# Patient Record
Sex: Male | Born: 1950 | Race: Black or African American | Hispanic: No | Marital: Single | Smoking: Never smoker
Health system: Southern US, Community
[De-identification: ages and names within clinical notes are randomized; demographics above are authoritative.]

## PROBLEM LIST (undated history)

## (undated) DIAGNOSIS — E119 Type 2 diabetes mellitus without complications: Secondary | ICD-10-CM

## (undated) DIAGNOSIS — I10 Essential (primary) hypertension: Secondary | ICD-10-CM

---

## 2013-06-04 ENCOUNTER — Emergency Department (HOSPITAL_COMMUNITY)
Admission: EM | Admit: 2013-06-04 | Discharge: 2013-06-04 | Disposition: A | Discharge status: Home / Self Care | Payer: Non-veteran care | Attending: Emergency Medicine | Admitting: Emergency Medicine

## 2013-06-04 ENCOUNTER — Encounter (HOSPITAL_COMMUNITY): Payer: Self-pay | Admitting: Emergency Medicine

## 2013-06-04 DIAGNOSIS — T2016XA Burn of first degree of forehead and cheek, initial encounter: Secondary | ICD-10-CM | POA: Insufficient documentation

## 2013-06-04 DIAGNOSIS — Y9389 Activity, other specified: Secondary | ICD-10-CM | POA: Insufficient documentation

## 2013-06-04 DIAGNOSIS — T2010XA Burn of first degree of head, face, and neck, unspecified site, initial encounter: Secondary | ICD-10-CM

## 2013-06-04 DIAGNOSIS — Z79899 Other long term (current) drug therapy: Secondary | ICD-10-CM | POA: Insufficient documentation

## 2013-06-04 DIAGNOSIS — IMO0002 Reserved for concepts with insufficient information to code with codable children: Secondary | ICD-10-CM | POA: Insufficient documentation

## 2013-06-04 DIAGNOSIS — Z791 Long term (current) use of non-steroidal anti-inflammatories (NSAID): Secondary | ICD-10-CM | POA: Insufficient documentation

## 2013-06-04 DIAGNOSIS — Y929 Unspecified place or not applicable: Secondary | ICD-10-CM | POA: Insufficient documentation

## 2013-06-04 MED ORDER — SILVER SULFADIAZINE 1 % EX CREA
TOPICAL_CREAM | Freq: Two times a day (BID) | CUTANEOUS | Status: DC
  Administered 2013-06-04: 14:00:00 via TOPICAL
  Filled 2013-06-04: qty 85

## 2013-06-04 MED ORDER — SILVER SULFADIAZINE 1 % EX CREA: TOPICAL_CREAM | Freq: Two times a day (BID) | CUTANEOUS | Status: AC

## 2013-06-04 NOTE — Discharge Instructions (Signed)
Please follow up with your primary care physician in 1-2 days. If you do not have one please call the [facility] number listed above. Please use silvadene cream as prescribed. Please read all discharge instructions and return precautions.    Burn Care Your skin is a natural barrier to infection. It is the largest organ of your body. Burns damage this natural protection. To help prevent infection, it is very important to follow your caregiver's instructions in the care of your burn. Burns are classified as:  First degree. There is only redness of the skin (erythema). No scarring is expected.  Second degree. There is blistering of the skin. Scarring may occur with deeper burns.  Third degree. All layers of the skin are injured, and scarring is expected. HOME CARE INSTRUCTIONS   Wash your hands well before changing your bandage.  Change your bandage as often as directed by your caregiver.  Remove the old bandage. If the bandage sticks, you may soak it off with cool, clean water.  Cleanse the burn thoroughly but gently with mild soap and water.  Pat the area dry with a clean, dry cloth.  Apply a thin layer of antibacterial cream to the burn.  Apply a clean bandage as instructed by your caregiver.  Keep the bandage as clean and dry as possible.  Elevate the affected area for the first 24 hours, then as instructed by your caregiver.  Only take over-the-counter or prescription medicines for pain, discomfort, or fever as directed by your caregiver. SEEK IMMEDIATE MEDICAL CARE IF:   You develop excessive pain.  You develop redness, tenderness, swelling, or red streaks near the burn.  The burned area develops yellowish-[place] fluid (pus) or a bad smell.  You have a fever. MAKE SURE YOU:   Understand these instructions.  Will watch your condition.  Will get help right away if you are not doing well or get worse. Document Released: 03/11/2005 Document Revised:  06/03/2011 Document Reviewed: 08/01/2010 [facility] Patient Information 2014 [employer], [place].
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
## 2013-06-04 NOTE — ED Notes (Signed)
PT ambulated with baseline gait; VSS; A&Ox3; no signs of distress; respirations even and unlabored; skin warm and dry; no questions upon discharge.  
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
## 2013-06-07 NOTE — ED Provider Notes (Signed)
Medical screening examination/treatment/procedure(s) were performed by non-physician practitioner and as supervising physician I was immediately available for consultation/collaboration.   Emry Barbato T Marialuisa Basara, MD 06/07/13 0733 

## 2013-06-16 ENCOUNTER — Ambulatory Visit: Payer: Self-pay

## 2013-06-16 ENCOUNTER — Encounter: Payer: Self-pay | Admitting: Internal Medicine

## 2013-06-16 ENCOUNTER — Ambulatory Visit: Payer: Self-pay | Attending: Internal Medicine | Admitting: Internal Medicine

## 2013-06-16 VITALS — BP 138/97 | HR 80 | Temp 98.1°F | Resp 16 | Ht 74.0 in | Wt 214.0 lb

## 2013-06-16 DIAGNOSIS — E785 Hyperlipidemia, unspecified: Secondary | ICD-10-CM | POA: Insufficient documentation

## 2013-06-16 DIAGNOSIS — T300 Burn of unspecified body region, unspecified degree: Secondary | ICD-10-CM

## 2013-06-16 DIAGNOSIS — F329 Major depressive disorder, single episode, unspecified: Secondary | ICD-10-CM | POA: Insufficient documentation

## 2013-06-16 DIAGNOSIS — F3289 Other specified depressive episodes: Secondary | ICD-10-CM | POA: Insufficient documentation

## 2013-06-16 DIAGNOSIS — Z09 Encounter for follow-up examination after completed treatment for conditions other than malignant neoplasm: Secondary | ICD-10-CM | POA: Insufficient documentation
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
## 2013-06-16 NOTE — Patient Instructions (Signed)
Burn Care  Your skin is a natural barrier to infection. It is the largest organ of your body. Burns damage this natural protection. To help prevent infection, it is very important to follow your caregiver's instructions in the care of your burn.  Burns are classified as:   First degree. There is only redness of the skin (erythema). No scarring is expected.   Second degree. There is blistering of the skin. Scarring may occur with deeper burns.   Third degree. All layers of the skin are injured, and scarring is expected.  HOME CARE INSTRUCTIONS    Wash your hands well before changing your bandage.   Change your bandage as often as directed by your caregiver.   Remove the old bandage. If the bandage sticks, you may soak it off with cool, clean water.   Cleanse the burn thoroughly but gently with mild soap and water.   Pat the area dry with a clean, dry cloth.   Apply a thin layer of antibacterial cream to the burn.   Apply a clean bandage as instructed by your caregiver.   Keep the bandage as clean and dry as possible.   Elevate the affected area for the first 24 hours, then as instructed by your caregiver.   Only take over-the-counter or prescription medicines for pain, discomfort, or fever as directed by your caregiver.  SEEK IMMEDIATE MEDICAL CARE IF:    You develop excessive pain.   You develop redness, tenderness, swelling, or red streaks near the burn.   The burned area develops yellowish-[place] fluid (pus) or a bad smell.   You have a fever.  MAKE SURE YOU:    Understand these instructions.   Will watch your condition.   Will get help right away if you are not doing well or get worse.  Document Released: 03/11/2005 Document Revised: 06/03/2011 Document Reviewed: 08/01/2010  ExitCare Patient Information 2014 [employer].

## 2013-07-26 ENCOUNTER — Ambulatory Visit: Payer: Self-pay | Admitting: Internal Medicine

## 2018-11-10 ENCOUNTER — Telehealth: Payer: Self-pay | Admitting: Internal Medicine

## 2018-11-10 NOTE — Telephone Encounter (Signed)
Pt appears on [facility] Quality Report for [facility], but has never been seen in the office.  I called the pt and left a message asking him to call me at [phone] to confirm his PCP.  VDM (DD)

## 2019-01-21 ENCOUNTER — Telehealth: Payer: Self-pay | Admitting: Internal Medicine

## 2019-01-21 NOTE — Telephone Encounter (Signed)
2nd attempt to reach pt to confirm PCP. No answer

## 2019-07-01 ENCOUNTER — Emergency Department (HOSPITAL_COMMUNITY): Payer: No Typology Code available for payment source

## 2019-07-01 ENCOUNTER — Emergency Department (HOSPITAL_COMMUNITY)
Admission: EM | Admit: 2019-07-01 | Discharge: 2019-07-01 | Disposition: A | Discharge status: Home / Self Care | Payer: No Typology Code available for payment source | Attending: Emergency Medicine | Admitting: Emergency Medicine

## 2019-07-01 ENCOUNTER — Encounter (HOSPITAL_COMMUNITY): Payer: Self-pay | Admitting: *Deleted

## 2019-07-01 DIAGNOSIS — E119 Type 2 diabetes mellitus without complications: Secondary | ICD-10-CM | POA: Insufficient documentation

## 2019-07-01 DIAGNOSIS — S20219A Contusion of unspecified front wall of thorax, initial encounter: Secondary | ICD-10-CM | POA: Diagnosis not present

## 2019-07-01 DIAGNOSIS — I10 Essential (primary) hypertension: Secondary | ICD-10-CM | POA: Insufficient documentation

## 2019-07-01 DIAGNOSIS — Y939 Activity, unspecified: Secondary | ICD-10-CM | POA: Insufficient documentation

## 2019-07-01 DIAGNOSIS — Y92410 Unspecified street and highway as the place of occurrence of the external cause: Secondary | ICD-10-CM | POA: Insufficient documentation

## 2019-07-01 DIAGNOSIS — Y999 Unspecified external cause status: Secondary | ICD-10-CM | POA: Insufficient documentation

## 2019-07-01 DIAGNOSIS — Z79899 Other long term (current) drug therapy: Secondary | ICD-10-CM | POA: Insufficient documentation

## 2019-07-01 DIAGNOSIS — S161XXA Strain of muscle, fascia and tendon at neck level, initial encounter: Secondary | ICD-10-CM | POA: Diagnosis not present

## 2019-07-01 DIAGNOSIS — S299XXA Unspecified injury of thorax, initial encounter: Secondary | ICD-10-CM | POA: Diagnosis present

## 2019-07-01 HISTORY — DX: Essential (primary) hypertension: I10

## 2019-07-01 HISTORY — DX: Type 2 diabetes mellitus without complications: E11.9

## 2019-07-01 LAB — I-STAT CREATININE, ED: Creatinine, Ser: 1.1 mg/dL (ref 0.61–1.24)

## 2019-07-01 MED ORDER — IOHEXOL 300 MG/ML  SOLN
75.0000 mL | Freq: Once | INTRAMUSCULAR | Status: AC | PRN
  Administered 2019-07-01: 75 mL via INTRAVENOUS
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
## 2019-07-01 NOTE — ED Notes (Signed)
Per provider C-collar removed.  No pain at this time. Pt verbalized  Understanding of discharge instructions and pain control. No further questions at this time.

## 2019-07-01 NOTE — Discharge Instructions (Addendum)
Return here as needed.  Return here for any worsening in your condition.  Your CT scans did not show any abnormalities at this time.  Follow-up with your primary care doctor.

## 2019-11-25 DIAGNOSIS — S0181XA Laceration without foreign body of other part of head, initial encounter: Secondary | ICD-10-CM | POA: Diagnosis not present

## 2019-11-25 DIAGNOSIS — W19XXXA Unspecified fall, initial encounter: Secondary | ICD-10-CM | POA: Diagnosis not present

## 2019-11-25 DIAGNOSIS — G8911 Acute pain due to trauma: Secondary | ICD-10-CM | POA: Diagnosis not present

## 2019-11-25 DIAGNOSIS — R55 Syncope and collapse: Secondary | ICD-10-CM | POA: Diagnosis not present

## 2019-11-25 DIAGNOSIS — Z20822 Contact with and (suspected) exposure to covid-19: Secondary | ICD-10-CM | POA: Diagnosis not present

## 2019-11-26 DIAGNOSIS — R55 Syncope and collapse: Secondary | ICD-10-CM | POA: Diagnosis not present

## 2019-11-27 DIAGNOSIS — E119 Type 2 diabetes mellitus without complications: Secondary | ICD-10-CM | POA: Diagnosis not present

## 2019-11-27 DIAGNOSIS — R55 Syncope and collapse: Secondary | ICD-10-CM | POA: Diagnosis not present

## 2020-04-14 DIAGNOSIS — R462 Strange and inexplicable behavior: Secondary | ICD-10-CM | POA: Diagnosis not present

## 2020-05-12 ENCOUNTER — Other Ambulatory Visit: Payer: Self-pay | Admitting: *Deleted
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
## 2020-05-16 ENCOUNTER — Other Ambulatory Visit: Payer: Self-pay

## 2020-05-16 NOTE — Patient Outreach (Addendum)
[facility]) Care Management  05/16/2020  Shaheed Schmuck 1950/10/08 [id number]   Referral date:05/12/20 Referral source:[employer] discharge notification report  Diagnosis: Falls, Anterior Cervical discectomy and fusion at C 4/5, small subacute right parietal infarct.( per care everywhere) Date of Discharge:05/11/20 Facility:[facility]  Insurance:[employer]    Outreach Attempt: No answer.  HIPAA compliant voice message left.    Plan: RN CM will attempt again within 4 business days.   Update: 10:15 am: received call from [phone]- male states wrong number for [facility].    Telephone call to Home number-number invalid.  Will update system.  Telephone call to Orvilla Fus- she is a care taker.  She will try to contact patient.   Spoke with patient. He states he is ok.  [facility] with care.  He states that [place] is his primary care and he has an appointment on 05-31-20.  Patient has medications but decline review as nurse is coming to review medications.  Discussed [employer] benefits and transportation.  Given transportation information.     Plan: RN CM will close case as patient not eligible for service at this time due to PCP being out of network.  Bary Leriche, RN, MSN [facility] Care Management Care Management Coordinator Direct Line [phone] Cell [phone] Toll Free: [phone]  Fax: [phone]

## 2020-08-06 ENCOUNTER — Other Ambulatory Visit: Payer: Self-pay

## 2020-08-06 ENCOUNTER — Emergency Department (HOSPITAL_COMMUNITY)
Admission: EM | Admit: 2020-08-06 | Discharge: 2020-08-06 | Disposition: A | Discharge status: Home / Self Care | Payer: No Typology Code available for payment source | Attending: Emergency Medicine | Admitting: Emergency Medicine

## 2020-08-06 DIAGNOSIS — I10 Essential (primary) hypertension: Secondary | ICD-10-CM | POA: Insufficient documentation

## 2020-08-06 DIAGNOSIS — Z79899 Other long term (current) drug therapy: Secondary | ICD-10-CM | POA: Insufficient documentation

## 2020-08-06 DIAGNOSIS — R21 Rash and other nonspecific skin eruption: Secondary | ICD-10-CM

## 2020-08-06 DIAGNOSIS — E119 Type 2 diabetes mellitus without complications: Secondary | ICD-10-CM | POA: Diagnosis not present

## 2020-08-06 DIAGNOSIS — L255 Unspecified contact dermatitis due to plants, except food: Secondary | ICD-10-CM | POA: Diagnosis not present

## 2020-08-06 DIAGNOSIS — R0902 Hypoxemia: Secondary | ICD-10-CM | POA: Diagnosis not present

## 2020-08-06 DIAGNOSIS — F17220 Nicotine dependence, chewing tobacco, uncomplicated: Secondary | ICD-10-CM | POA: Diagnosis not present

## 2020-08-06 LAB — CBC WITH DIFFERENTIAL/PLATELET
Abs Immature Granulocytes: 0.02 10*3/uL (ref 0.00–0.07)
Basophils Absolute: 0 10*3/uL (ref 0.0–0.1)
Basophils Relative: 1 %
Eosinophils Absolute: 0.3 10*3/uL (ref 0.0–0.5)
Eosinophils Relative: 4 %
HCT: 40.9 % (ref 39.0–52.0)
Hemoglobin: 13.4 g/dL (ref 13.0–17.0)
Immature Granulocytes: 0 %
Lymphocytes Relative: 29 %
Lymphs Abs: 2 10*3/uL (ref 0.7–4.0)
MCH: 29.3 pg (ref 26.0–34.0)
MCHC: 32.8 g/dL (ref 30.0–36.0)
MCV: 89.5 fL (ref 80.0–100.0)
Monocytes Absolute: 0.3 10*3/uL (ref 0.1–1.0)
Monocytes Relative: 5 %
Neutro Abs: 4.2 10*3/uL (ref 1.7–7.7)
Neutrophils Relative %: 61 %
Platelets: 297 10*3/uL (ref 150–400)
RBC: 4.57 MIL/uL (ref 4.22–5.81)
RDW: 14.6 % (ref 11.5–15.5)
WBC: 6.9 10*3/uL (ref 4.0–10.5)
nRBC: 0 % (ref 0.0–0.2)

## 2020-08-06 LAB — COMPREHENSIVE METABOLIC PANEL
ALT: 22 U/L (ref 0–44)
AST: 31 U/L (ref 15–41)
Albumin: 4.3 g/dL (ref 3.5–5.0)
Alkaline Phosphatase: 64 U/L (ref 38–126)
Anion gap: 8 (ref 5–15)
BUN: 12 mg/dL (ref 8–23)
CO2: 27 mmol/L (ref 22–32)
Calcium: 9.6 mg/dL (ref 8.9–10.3)
Chloride: 104 mmol/L (ref 98–111)
Creatinine, Ser: 1.21 mg/dL (ref 0.61–1.24)
GFR, Estimated: >60 mL/min (ref >60)
Glucose, Bld: 122 mg/dL — ABNORMAL HIGH (ref 70–99)
Potassium: 3.7 mmol/L (ref 3.5–5.1)
Sodium: 139 mmol/L (ref 135–145)
Total Bilirubin: 0.7 mg/dL (ref 0.3–1.2)
Total Protein: 7.2 g/dL (ref 6.5–8.1)

## 2020-08-06 LAB — RPR: RPR Ser Ql: NONREACTIVE

## 2020-08-06 MED ORDER — METHYLPREDNISOLONE 4 MG PO TBPK: ORAL_TABLET | ORAL | 0 refills | Status: DC

## 2020-08-06 MED ORDER — HYDROXYZINE HCL 25 MG PO TABS: 25.0000 mg | ORAL_TABLET | Freq: Four times a day (QID) | ORAL | 0 refills | Status: AC

## 2020-08-06 NOTE — Discharge Instructions (Addendum)
Your labs showed no significant abnormalities. I am unsure of the cause of your rash. I discharging you with medication for treatment of your rash and itching. Please note that the atarax can make you very sleepy and  you should not drive if you take it.  Also avoid taking benadryl with this medication. Get help right away if you: Have a fever and your symptoms suddenly get worse. Develop confusion. Swelling of the mouth or throat. Difficulty breathing. Have a severe headache or a stiff neck. Have severe joint pains or stiffness. Have a seizure. Develop a rash that covers all or most of your body. The rash may or may not be painful. Develop blisters that: Are on top of the rash. Grow larger or grow together. Are painful. Are inside your nose or mouth. Develop a rash that: Looks like purple pinprick-sized spots all over your body. Has a "bull's eye" or looks like a target. Is not related to sun exposure, is red and painful, and causes your skin to peel.

## 2020-08-06 NOTE — ED Provider Notes (Signed)
[facility] EMERGENCY DEPARTMENT Provider Note   CSN: [id number] Arrival date & time: 08/06/20  0815     History No chief complaint on file.   Luis Hawkins is a 70 y.o. male who presents with a cc of pruritic rash. Onset 2 days ago. Located on left palm, fingers, arms, chest, face, ears, and neck. No new meds, no changes in lotions, soaps, or detergents. No history of the same. No hx of eczema.  Has worked outside. No fever, flu-like sxs or known tick bites. He has tried benadryl and hydrocortizone without relief of sxs.  HPI     Past Medical History:  Diagnosis Date  . Diabetes mellitus without complication (HCC)   . Hypertension     There are no problems to display for this patient.   No past surgical history on file.     No family history on file.  Social History   Tobacco Use  . Smoking status: Never Smoker  . Smokeless tobacco: Current User    Types: Chew  Substance Use Topics  . Alcohol use: No  . Drug use: No    Home Medications Prior to Admission medications   Medication Sig Start Date End Date Taking? Authorizing Provider  docusate sodium (COLACE) 100 MG capsule Take 100 mg by mouth daily.    [provider]  guaiFENesin 200 MG tablet Take 400 mg by mouth every 4 (four) hours as needed for cough or to loosen phlegm.    [provider]  ipratropium (ATROVENT) 0.06 % nasal spray Place 2 sprays into both nostrils daily.    [provider]  levothyroxine (SYNTHROID) 25 MCG tablet Take 25 mcg by mouth daily before breakfast.    [provider]  losartan (COZAAR) 50 MG tablet Take 50 mg by mouth daily.    [provider]  meloxicam (MOBIC) 7.5 MG tablet Take 15 mg by mouth daily.    [provider]  metoprolol tartrate (LOPRESSOR) 25 MG tablet Take 25 mg by mouth 2 (two) times daily.    [provider]  mirtazapine (REMERON) 15 MG tablet Take 15 mg by mouth at bedtime.     [provider]  OLANZapine (ZYPREXA) 10 MG tablet Take 10 mg by mouth at bedtime.    [provider]  omega-3 acid ethyl esters (LOVAZA) 1 g capsule Take 1 g by mouth daily.    [provider]  pantoprazole (PROTONIX) 20 MG tablet Take 20 mg by mouth daily.    [provider]  silver sulfADIAZINE (SILVADENE) 1 % cream Apply topically 2 (two) times daily. Patient not taking: Reported on 07/01/2019 06/04/13   Piepenbrink, Victorino Dike, PA-C  simvastatin (ZOCOR) 10 MG tablet Take 10 mg by mouth daily.    [provider]  vardenafil (LEVITRA) 20 MG tablet Take 20 mg by mouth daily as needed for erectile dysfunction.    [provider]    Allergies    Patient has no known allergies.  Review of Systems   Review of Systems Ten systems reviewed and are negative for acute change, except as noted in the HPI.   Physical Exam Updated Vital Signs BP (!) 158/100 (BP Location: Left Arm)   Pulse 95   Temp 98.2 F (36.8 C) (Oral)   Resp 20   Ht 6\' 2"  (1.88 m)   Wt 79.4 kg   SpO2 97%   BMI 22.47 kg/m   Physical Exam Vitals and nursing note reviewed.  Constitutional:  General: He is not in acute distress.    Appearance: He is well-developed. He is not diaphoretic.  HENT:     Head: Normocephalic and atraumatic.  Eyes:     General: No scleral icterus.    Conjunctiva/sclera: Conjunctivae normal.  Cardiovascular:     Rate and Rhythm: Normal rate and regular rhythm.     Heart sounds: Normal heart sounds.  Pulmonary:     Effort: Pulmonary effort is normal. No respiratory distress.     Breath sounds: Normal breath sounds.  Abdominal:     Palpations: Abdomen is soft.     Tenderness: There is no abdominal tenderness.  Musculoskeletal:     Cervical back: Normal range of motion and neck supple.  Skin:    General: Skin is warm and dry.     Comments: Left palm with vesicular eruption near the thenar eminence.  Small singular vesicles on the  dorsum of BL hands. Facial skin is tight, drys, and drawn with angular cheilitis, no vesicles or desquamation. Dry scaling ear canals. No obvious scalp involvement. No involvement of the mucous membranes.   Neurological:     Mental Status: He is alert.  Psychiatric:        Behavior: Behavior normal.     ED Results / Procedures / Treatments   Labs (all labs ordered are listed, but only abnormal results are displayed) Labs Reviewed - No data to display  EKG None  Radiology No results found.  Procedures Procedures   Medications Ordered in ED Medications - No data to display  ED Course  I have reviewed the triage vital signs and the nursing notes.  Pertinent labs & imaging results that were available during my care of the patient were reviewed by me and considered in my medical decision making (see chart for details).    MDM Rules/Calculators/A&P                          Patient with nonspecific eruption. No signs of infection. No signs of SJS, TENS, DRESS. Labs ordered  And wnl except for mild hyperglycemia. RPR pending. No petechiae, afebrile, doubt tick born illness or RMSF. Likely contact or dyshidrotic dermatitis.  D/c with prednisone and atarax.   Follow up with PCP in 2-3 days.       Final Clinical Impression(s) / ED Diagnoses Final diagnoses:  None    Rx / DC Orders ED Discharge Orders    None       Arthor Captain, PA-C 08/06/20 1021    Benjiman Core, MD 08/07/20 1455

## 2020-08-06 NOTE — ED Triage Notes (Signed)
Pt arrives via EMS from home. Ambulatory with walker. Pt complains of numbness/ tingles in hands and arms X2 days. Visible rash on hands.  185 palpated HR 105 97% RA T 97.7 CBG 137 RR 18
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
## 2021-09-13 ENCOUNTER — Encounter: Payer: Self-pay | Admitting: Neurology

## 2022-02-19 ENCOUNTER — Ambulatory Visit: Payer: Medicare HMO | Admitting: Neurology

## 2022-04-12 ENCOUNTER — Ambulatory Visit (HOSPITAL_COMMUNITY)
Admission: EM | Admit: 2022-04-12 | Discharge: 2022-04-12 | Disposition: A | Discharge status: Home / Self Care | Payer: Non-veteran care | Attending: Internal Medicine | Admitting: Internal Medicine

## 2022-04-12 ENCOUNTER — Ambulatory Visit (INDEPENDENT_AMBULATORY_CARE_PROVIDER_SITE_OTHER): Payer: Non-veteran care

## 2022-04-12 ENCOUNTER — Encounter (HOSPITAL_COMMUNITY): Payer: Self-pay | Admitting: Emergency Medicine

## 2022-04-12 DIAGNOSIS — R0602 Shortness of breath: Secondary | ICD-10-CM | POA: Diagnosis not present

## 2022-04-12 DIAGNOSIS — J040 Acute laryngitis: Secondary | ICD-10-CM | POA: Diagnosis not present

## 2022-04-12 DIAGNOSIS — R059 Cough, unspecified: Secondary | ICD-10-CM | POA: Diagnosis not present

## 2022-04-12 DIAGNOSIS — J029 Acute pharyngitis, unspecified: Secondary | ICD-10-CM

## 2022-04-12 DIAGNOSIS — J209 Acute bronchitis, unspecified: Secondary | ICD-10-CM

## 2022-04-12 MED ORDER — ALBUTEROL SULFATE HFA 108 (90 BASE) MCG/ACT IN AERS
1.0000 | INHALATION_SPRAY | Freq: Once | RESPIRATORY_TRACT | Status: AC
  Administered 2022-04-12: 2 via RESPIRATORY_TRACT

## 2022-04-12 MED ORDER — AEROCHAMBER PLUS FLO-VU MEDIUM MISC
1.0000 | Freq: Once | Status: AC
  Administered 2022-04-12: 1

## 2022-04-12 MED ORDER — BENZONATATE 100 MG PO CAPS: 100.0000 mg | ORAL_CAPSULE | Freq: Three times a day (TID) | ORAL | 0 refills | Status: AC

## 2022-04-12 MED ORDER — ALBUTEROL SULFATE HFA 108 (90 BASE) MCG/ACT IN AERS
INHALATION_SPRAY | RESPIRATORY_TRACT | Status: AC
  Filled 2022-04-12: qty 6.7

## 2022-04-12 MED ORDER — PREDNISONE 20 MG PO TABS
20.0000 mg | ORAL_TABLET | Freq: Once | ORAL | Status: AC
  Administered 2022-04-12: 20 mg via ORAL

## 2022-04-12 MED ORDER — AEROCHAMBER PLUS FLO-VU LARGE MISC
Status: AC
  Filled 2022-04-12: qty 1

## 2022-04-12 MED ORDER — PREDNISONE 20 MG PO TABS: 20.0000 mg | ORAL_TABLET | Freq: Every day | ORAL | 0 refills | Status: AC

## 2022-04-12 MED ORDER — PREDNISONE 20 MG PO TABS
ORAL_TABLET | ORAL | Status: AC
  Filled 2022-04-12: qty 1

## 2022-04-12 NOTE — ED Triage Notes (Signed)
Pt c/o hoarse voice and sore throat since last week. Using throat lozenges. Pt has cough as well, that is productive with yellow phlegm.

## 2022-04-12 NOTE — ED Provider Notes (Signed)
[facility]    CSN: [id number] Arrival date & time: 04/12/22  1315      History   Chief Complaint Chief Complaint  Patient presents with  . Hoarse  . Sore Throat    HPI Luis Hawkins is a 72 y.o. male.   Patient presents to urgent care for evaluation of your throat, cough, nasal congestion, generalized weakness, generalized fatigue, and voice hoarseness that started 7 days ago.  Cough is dry and nonproductive.  Throat pain is currently a 9 on a scale of 0-10.  He takes Mobic daily but states this has not helped with his pain at all.  He has been using throat lozenges without relief of pain. No recent exposure to known sick contacts. Reports he "may have a history of asthma" but can't remember. He quit smoking 1 year ago, denies drug use. States he is slightly short of breath, denies chest pain and heart palpitations. No weakness, nausea, vomiting, dizziness, diarrhea, abdominal pain, fever/chills, nasal congestion, recent dental problems, rash, or recent  falls reported. Denies recent antibiotic or steroid use. No sick contacts with similar symptoms.    Sore Throat   Past Medical History:  Diagnosis Date  . Diabetes mellitus without complication (HCC)   . Hypertension     There are no problems to display for this patient.   History reviewed. No pertinent surgical history.     Home Medications    Prior to Admission medications   Medication Sig Start Date End Date Taking? Authorizing Provider  benzonatate (TESSALON) 100 MG capsule Take 1 capsule (100 mg total) by mouth every 8 (eight) hours. 04/12/22  Yes Carlisle Beers, FNP  predniSONE (DELTASONE) 20 MG tablet Take 1 tablet (20 mg total) by mouth daily for 5 days. 04/12/22 04/17/22 Yes Spike Desilets, Donavan Burnet, FNP  docusate sodium (COLACE) 100 MG capsule Take 100 mg by mouth daily.    [provider]  guaiFENesin 200 MG tablet Take 400 mg by mouth every 4 (four) hours as needed for cough or to loosen  phlegm.    [provider]  hydrOXYzine (ATARAX/VISTARIL) 25 MG tablet Take 1 tablet (25 mg total) by mouth every 6 (six) hours. 08/06/20   Harris, Cammy Copa, PA-C  ipratropium (ATROVENT) 0.06 % nasal spray Place 2 sprays into both nostrils daily.    [provider]  levothyroxine (SYNTHROID) 25 MCG tablet Take 25 mcg by mouth daily before breakfast.    [provider]  losartan (COZAAR) 50 MG tablet Take 50 mg by mouth daily.    [provider]  meloxicam (MOBIC) 7.5 MG tablet Take 15 mg by mouth daily.    [provider]  metoprolol tartrate (LOPRESSOR) 25 MG tablet Take 25 mg by mouth 2 (two) times daily.    [provider]  mirtazapine (REMERON) 15 MG tablet Take 15 mg by mouth at bedtime.    [provider]  OLANZapine (ZYPREXA) 10 MG tablet Take 10 mg by mouth at bedtime.    [provider]  omega-3 acid ethyl esters (LOVAZA) 1 g capsule Take 1 g by mouth daily.    [provider]  pantoprazole (PROTONIX) 20 MG tablet Take 20 mg by mouth daily.    [provider]  silver sulfADIAZINE (SILVADENE) 1 % cream Apply topically 2 (two) times daily. Patient not taking: Reported on 07/01/2019 06/04/13   Piepenbrink, Victorino Dike, PA-C  simvastatin (ZOCOR) 10 MG tablet Take 10 mg by mouth daily.    [provider]  vardenafil (LEVITRA) 20 MG tablet Take 20 mg by mouth daily as needed for erectile dysfunction.    [provider]    Family History No family history on file.  Social History Social History   Tobacco Use  . Smoking status: Never  . Smokeless tobacco: Current    Types: Chew  Substance Use Topics  . Alcohol use: No  . Drug use: No     Allergies   Patient has no known allergies.   Review of Systems Review of Systems Per HPI  Physical Exam Triage Vital Signs ED Triage Vitals  Enc Vitals Group     BP 04/12/22 1523 120/80     Pulse Rate 04/12/22 1523 96     Resp 04/12/22  1523 18     Temp 04/12/22 1523 98.2 F (36.8 C)     Temp Source 04/12/22 1523 Oral     SpO2 04/12/22 1523 96 %     Weight --      Height --      Head Circumference --      Peak Flow --      Pain Score 04/12/22 1522 8     Pain Loc --      Pain Edu? --      Excl. in [place]? --    No data found.  Updated Vital Signs BP 120/80 (BP Location: Right Arm)   Pulse 96   Temp 98.2 F (36.8 C) (Oral)   Resp 18   SpO2 96%   Visual Acuity Right Eye Distance:   Left Eye Distance:   Bilateral Distance:    Right Eye Near:   Left Eye Near:    Bilateral Near:     Physical Exam Vitals and nursing note reviewed.  Constitutional:      Appearance: He is not ill-appearing or toxic-appearing.  HENT:     Head: Normocephalic and atraumatic.     Right Ear: Hearing, tympanic membrane, ear canal and external ear normal.     Left Ear: Hearing, tympanic membrane, ear canal and external ear normal.     Nose: Rhinorrhea present.     Right Turbinates: Not swollen or pale.     Left Turbinates: Not swollen or pale.     Mouth/Throat:     Lips: Pink.     Mouth: Mucous membranes are moist. No injury or oral lesions.     Dentition: Normal dentition. No dental tenderness or dental caries.     Tongue: No lesions. Tongue does not deviate from midline.     Palate: No mass and lesions.     Pharynx: Uvula midline. Posterior oropharyngeal erythema present. No pharyngeal swelling or uvula swelling.     Tonsils: No tonsillar exudate or tonsillar abscesses.     Comments: Small amount of clear postnasal drainage visualized to the posterior oropharynx. Slight erythema to the posterior oropharynx.  Eyes:     General: Lids are normal. Vision grossly intact. Gaze aligned appropriately.     Extraocular Movements: Extraocular movements intact.     Conjunctiva/sclera: Conjunctivae normal.  Cardiovascular:     Rate and Rhythm: Normal rate and regular rhythm.     Heart sounds: Normal heart sounds, S1 normal and S2 normal.   Pulmonary:     Effort: Pulmonary effort is normal. No respiratory distress.     Breath sounds: Normal air entry. Examination of the right-lower field reveals wheezing and rhonchi. Examination of the left-lower field reveals wheezing and rhonchi. Wheezing and  rhonchi present.     Comments: Expiratory wheeze and rhonchi heard to bilateral lower lung fields. No respiratory distress, accessory muscle usage, or tachypnea.  Musculoskeletal:     Cervical back: Neck supple.  Skin:    General: Skin is warm and dry.     Capillary Refill: Capillary refill takes less than 2 seconds.     Findings: No rash.  Neurological:     General: No focal deficit present.     Mental Status: He is alert and oriented to person, place, and time. Mental status is at baseline.     Cranial Nerves: No dysarthria or facial asymmetry.  Psychiatric:        Mood and Affect: Mood normal.        Speech: Speech normal.        Behavior: Behavior normal.        Thought Content: Thought content normal.        Judgment: Judgment normal.     UC Treatments / Results  Labs (all labs ordered are listed, but only abnormal results are displayed) Labs Reviewed - No data to display  EKG   Radiology DG Chest 2 View  Result Date: 04/12/2022 CLINICAL DATA:  Cough and shortness of breath EXAM: CHEST - 2 VIEW COMPARISON:  Chest x-ray July 01, 2019 FINDINGS: The cardiomediastinal silhouette is unchanged in contour. No focal pulmonary opacity. No pleural effusion or pneumothorax. The visualized upper abdomen is unremarkable. No acute osseous abnormality. IMPRESSION: No acute cardiopulmonary abnormality. Electronically Signed   By: Beryle Flock M.D.   On: 04/12/2022 16:27    Procedures Procedures (including critical care time)  Medications Ordered in UC Medications  albuterol (VENTOLIN HFA) 108 (90 Base) MCG/ACT inhaler 1-2 puff (2 puffs Inhalation Given 04/12/22 1643)  AeroChamber Plus Flo-Vu Medium MISC 1 each (1 each Other  Given 04/12/22 1643)  predniSONE (DELTASONE) tablet 20 mg (20 mg Oral Given 04/12/22 1643)    Initial Impression / Assessment and Plan / UC Course  I have reviewed the triage vital signs and the nursing notes.  Pertinent labs & imaging results that were available during my care of the patient were reviewed by me and considered in my medical decision making (see chart for details).   1. Acute bronchitis, sore throat, laryngitis Presentation is consistent with acute viral bronchitis that will likely improve with use of steroid and cough medications along with rest and increase fluid intake over the next few days.  Chest x-ray performed due to persistent cough is negative for active cardiopulmonary disease/abnormality. Patient is nontoxic in appearance with hemodynamically stable vital signs. Oxygenating well on room air without new oxygen requirement.   Given albuterol with spacer and first dose of prednisone dose in clinic for cough and wheeze. Steroid burst (prednisone 20mg  once daily for 5 days with food) and tessalon perles sent to pharmacy for symptomatic relief to be taken as prescribed. No NSAIDs while taking steroid. Advised to push fluids to stay well hydrated.   Discussed physical exam and available lab work findings in clinic with patient.  Counseled patient regarding appropriate use of medications and potential side effects for all medications recommended or prescribed today. Discussed red flag signs and symptoms of worsening condition,when to call the PCP office, return to urgent care, and when to seek higher level of care in the emergency department. Patient verbalizes understanding and agreement with plan. All questions answered. Patient discharged in stable condition.    Final Clinical Impressions(s) / UC Diagnoses  Final diagnoses:  Acute bronchitis, unspecified organism  Laryngitis  Sore throat     Discharge Instructions      You have bronchitis which is inflammation of  the upper airways in your lungs due to a virus. The following medicines will help with your symptoms.  Your chest x-ray is negative for pneumonia and other abnormalities.  - Take steroid sent to pharmacy as directed. Do not take any other NSAID containing medications such as ibuprofen or naproxen/Aleve while taking prednisone. - You may use albuterol inhaler 1 to 2 puffs every 4-6 hours as needed for cough, shortness of breath, and wheezing. - Tessalon perles every 8 hours as needed for cough.  If you develop any new or worsening symptoms or do not improve in the next 2 to 3 days, please return.  If your symptoms are severe, please go to the emergency room.  Follow-up with your primary care provider for further evaluation and management of your symptoms as well as ongoing wellness visits.  I hope you feel better!     ED Prescriptions     Medication Sig Dispense Auth. Provider   predniSONE (DELTASONE) 20 MG tablet Take 1 tablet (20 mg total) by mouth daily for 5 days. 5 tablet Reita May M, FNP   benzonatate (TESSALON) 100 MG capsule Take 1 capsule (100 mg total) by mouth every 8 (eight) hours. 21 capsule Carlisle Beers, FNP      PDMP not reviewed this encounter.   Carlisle Beers, [place] 04/12/22 1656

## 2022-04-12 NOTE — Discharge Instructions (Addendum)
You have bronchitis which is inflammation of the upper airways in your lungs due to a virus. The following medicines will help with your symptoms.  Your chest x-ray is negative for pneumonia and other abnormalities.  - Take steroid sent to pharmacy as directed. Do not take any other NSAID containing medications such as ibuprofen or naproxen/Aleve while taking prednisone. - You may use albuterol inhaler 1 to 2 puffs every 4-6 hours as needed for cough, shortness of breath, and wheezing. - Tessalon perles every 8 hours as needed for cough.  If you develop any new or worsening symptoms or do not improve in the next 2 to 3 days, please return.  If your symptoms are severe, please go to the emergency room.  Follow-up with your primary care provider for further evaluation and management of your symptoms as well as ongoing wellness visits.  I hope you feel better!
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
## 2022-04-26 ENCOUNTER — Other Ambulatory Visit (INDEPENDENT_AMBULATORY_CARE_PROVIDER_SITE_OTHER): Payer: Medicare PPO

## 2022-04-26 ENCOUNTER — Ambulatory Visit (INDEPENDENT_AMBULATORY_CARE_PROVIDER_SITE_OTHER): Payer: Medicare PPO | Admitting: Neurology

## 2022-04-26 ENCOUNTER — Encounter: Payer: Self-pay | Admitting: Neurology

## 2022-04-26 VITALS — BP 123/86 | HR 94 | Resp 20 | Ht 74.0 in | Wt 193.0 lb

## 2022-04-26 DIAGNOSIS — M6281 Muscle weakness (generalized): Secondary | ICD-10-CM | POA: Diagnosis not present

## 2022-04-26 DIAGNOSIS — G8929 Other chronic pain: Secondary | ICD-10-CM

## 2022-04-26 DIAGNOSIS — M542 Cervicalgia: Secondary | ICD-10-CM

## 2022-04-26 DIAGNOSIS — M545 Low back pain, unspecified: Secondary | ICD-10-CM | POA: Diagnosis not present

## 2022-04-26 LAB — VITAMIN B12: Vitamin B-12: 768 pg/mL (ref 211–911)

## 2022-04-26 LAB — CK: Total CK: 172 U/L (ref 7–232)

## 2022-04-26 NOTE — Patient Instructions (Signed)
[facility] Will check nerve conduction study of left arm and leg Will check CK, B1 and B12 Follow up after testing.

## 2022-04-30 LAB — VITAMIN B1: Vitamin B1 (Thiamine): 22 nmol/L (ref 8–30)

## 2022-05-14 ENCOUNTER — Encounter: Payer: Medicare PPO | Admitting: Neurology

## 2022-05-14 ENCOUNTER — Encounter: Payer: Self-pay | Admitting: Neurology

## 2022-05-14 DIAGNOSIS — Z029 Encounter for administrative examinations, unspecified: Secondary | ICD-10-CM

## 2022-05-17 ENCOUNTER — Ambulatory Visit (INDEPENDENT_AMBULATORY_CARE_PROVIDER_SITE_OTHER): Payer: No Typology Code available for payment source | Admitting: Orthopaedic Surgery

## 2022-05-17 ENCOUNTER — Ambulatory Visit (INDEPENDENT_AMBULATORY_CARE_PROVIDER_SITE_OTHER): Payer: Medicare PPO

## 2022-05-17 DIAGNOSIS — M25562 Pain in left knee: Secondary | ICD-10-CM

## 2022-05-17 DIAGNOSIS — G8929 Other chronic pain: Secondary | ICD-10-CM

## 2022-05-17 MED ORDER — LIDOCAINE HCL 1 % IJ SOLN
2.0000 mL | INTRAMUSCULAR | Status: AC | PRN
  Administered 2022-05-17: 2 mL

## 2022-05-17 MED ORDER — METHYLPREDNISOLONE ACETATE 40 MG/ML IJ SUSP
40.0000 mg | INTRAMUSCULAR | Status: AC | PRN
  Administered 2022-05-17: 40 mg via INTRA_ARTICULAR

## 2022-05-17 MED ORDER — BUPIVACAINE HCL 0.5 % IJ SOLN
2.0000 mL | INTRAMUSCULAR | Status: AC | PRN
  Administered 2022-05-17: 2 mL via INTRA_ARTICULAR

## 2022-05-17 NOTE — Progress Notes (Signed)
Office Visit Note   Patient: Luis Hawkins           Date of Birth: 09/29/1950           MRN: [id number] Visit Date: 05/17/2022              Requested by: [facility] [address] [place],  [place] [address] PCP: [place]: Visit Diagnoses:  1. Chronic pain of left knee     Plan: Luis Hawkins is a 72 year old gentleman with chronic left knee pain.  This could be due to arthritis or post stroke weakness and muscular pain.  We will try cortisone shot to see if this is effective.  If so he could consider these every 3 to 4 months.  I have also strongly recommended that he try outpatient physical therapy.  Follow-up with me as needed.  Follow-Up Instructions: No follow-ups on file.   Orders:  Orders Placed This Encounter  Procedures   XR KNEE 3 VIEW LEFT   No orders of the defined types were placed in this encounter.     Procedures: Large Joint Inj: L knee on 05/17/2022 1:51 PM Details: 22 G needle Medications: 2 mL bupivacaine 0.5 %; 2 mL lidocaine 1 %; 40 mg methylPREDNISolone acetate 40 MG/ML Outcome: tolerated well, no immediate complications Patient was prepped and draped in the usual sterile fashion.       Clinical Data: No additional findings.   Subjective: Chief Complaint  Patient presents with   Left Knee - Pain    HPI  Luis Hawkins is a pleasant 72 year old gentleman here with his friend today.  He has had chronic left knee pain for about 2 years that started after he had a stroke.  He now ambulates with a walker.  He has anterior knee pain.  He uses a lidocaine patch.  Review of Systems  Constitutional: Negative.   HENT: Negative.    Eyes: Negative.   Respiratory: Negative.    Cardiovascular: Negative.   Gastrointestinal: Negative.   Endocrine: Negative.   Genitourinary: Negative.   Skin: Negative.   Allergic/Immunologic: Negative.   Neurological: Negative.   Hematological: Negative.   Psychiatric/Behavioral:  Negative.    All other systems reviewed and are negative.    Objective: Vital Signs: There were no vitals taken for this visit.  Physical Exam Vitals and nursing note reviewed.  Constitutional:      Appearance: He is well-developed.  HENT:     Head: Normocephalic and atraumatic.  Eyes:     Pupils: Pupils are equal, round, and reactive to light.  Pulmonary:     Effort: Pulmonary effort is normal.  Abdominal:     Palpations: Abdomen is soft.  Musculoskeletal:        General: Normal range of motion.     Cervical back: Neck supple.  Skin:    General: Skin is warm.  Neurological:     Mental Status: He is alert and oriented to person, place, and time.  Psychiatric:        Behavior: Behavior normal.        Thought Content: Thought content normal.        Judgment: Judgment normal.     Ortho Exam  Examination of left knee shows no joint effusion.  Collaterals and cruciates are stable.  Normal range of motion.  Specialty Comments:  No specialty comments available.  Imaging: XR KNEE 3 VIEW LEFT  Result Date: 05/17/2022 Mildly decreased joint spaces.  No acute abnormalities.    PMFS History: There are no problems to display for this patient.  Past Medical History:  Diagnosis Date   Diabetes mellitus without complication ([place])    Hypertension     No family history on file.  No past surgical history on file. Social History   Occupational History   Not on file  Tobacco Use   Smoking status: Never   Smokeless tobacco: Current    Types: Chew  Substance and Sexual Activity   Alcohol use: No   Drug use: No   Sexual activity: Not on file

## 2022-06-11 ENCOUNTER — Encounter: Payer: Medicare PPO | Admitting: Neurology

## 2022-06-17 ENCOUNTER — Ambulatory Visit (INDEPENDENT_AMBULATORY_CARE_PROVIDER_SITE_OTHER): Payer: No Typology Code available for payment source | Admitting: Neurology

## 2022-06-17 DIAGNOSIS — G8929 Other chronic pain: Secondary | ICD-10-CM

## 2022-06-17 DIAGNOSIS — M6281 Muscle weakness (generalized): Secondary | ICD-10-CM

## 2022-06-17 DIAGNOSIS — G629 Polyneuropathy, unspecified: Secondary | ICD-10-CM

## 2022-06-17 DIAGNOSIS — M5412 Radiculopathy, cervical region: Secondary | ICD-10-CM

## 2022-06-17 DIAGNOSIS — M5417 Radiculopathy, lumbosacral region: Secondary | ICD-10-CM

## 2022-06-17 DIAGNOSIS — M545 Low back pain, unspecified: Secondary | ICD-10-CM

## 2022-06-17 NOTE — Procedures (Signed)
[facility]  [address], [place]  [place], [place] [address] Tel: [phone] Fax: [phone] Test Date:  06/17/2022  Patient: Luis Hawkins DOB: 01-01-1951 Physician: Kai Levins, MD  Sex: Male Height: 6\' 2"  Ref Phys: Metta Clines, DO  ID#: [id number]   Technician:    History: This is a 71 year old male with weakness.  NCV & EMG Findings: Extensive electrodiagnostic evaluation of the left upper and lower limbs shows: Left sural and superficial peroneal/fibular sensory responses are absent. Left median, ulnar, and radial sensory responses show prolonged distal peak latency (median 3.9, ulnar 3.9, radial 3.1 ms). This may be due to temperature artifact as patient was difficult to keep above 32C.  Left peroneal/fibular (EDB), tibial (AH), and median (APB) are within normal limits. Left ulnar (ADM) shows reduced conduction velocity from above elbow to below elbow stimulation sites (38 m/s).  Left H reflex is within normal limits. Chronic motor axon loss changes without accompanying active denervation changes are seen in the left tibialis anterior, medial head of gastrocnemius, short head of biceps femoris, lumbosacral paraspinal (L5 level), pronator teres, triceps, and cervical paraspinal (C7 level) muscles.   Impression: This is a complex study. 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 The residuals of an old intraspinal canal lesion (ie: motor radiculopathy) at the left S1 root or segment, mild to moderate in degree electrically. The residuals of an old intraspinal canal lesion (ie: motor radiculopathy) at the left C6-C7 roots or segments, mild to moderate in degree electrically. 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 Prolonged distal peak latency prolongation of left median  and radial sensory responses are likely due to temperature artifact.    ___________________________ Kai Levins, MD    Nerve Conduction Studies Motor Nerve Results    Latency Amplitude F-Lat Segment Distance CV Comment  Site (ms) Norm (mV) Norm (ms)  (cm) (m/s) Norm ~30C in hand, 29C in foot  Left Fibular (EDB) Motor  Ankle 5.4  < 6.0 2.6  > 2.5        Bel fib head 15.1 - 2.2 -  Bel fib head-Ankle 39 40  > 40   Pop fossa 17.7 - 2.0 -  Pop fossa-Bel fib head 10 40 -   Left Median (APB) Motor  Wrist 3.9  < 4.0 6.9  > 5.0        Elbow 10.0 - 6.5 -  Elbow-Wrist 31.5 52  > 50   Left Tibial (AH) Motor  Ankle 5.8  < 6.0 5.2  > 4.0        Knee 17.7 - 3.4 -  Knee-Ankle 49 41  > 40   Left Ulnar (ADM) Motor  Wrist 2.8  < 3.1 9.2  > 7.0        Bel elbow 8.0 - 8.3 -  Bel elbow-Wrist 27 52  > 50   Ab elbow 10.6 - 8.0 -  Ab elbow-Bel elbow 10 38 -    Sensory Sites    Neg Peak Lat Amplitude (O-P) Segment Distance Velocity Comment  Site (ms) Norm (V) Norm  (cm) (ms)   Left Median Sensory  Wrist-Dig II *3.9  < 3.8 14  > 10 Wrist-Dig II 13    Left Radial Sensory  Forearm-Wrist *3.1  < 2.8 12  > 10 Forearm-Wrist 10  Left Superficial Fibular Sensory  14 cm-Ankle *NR  < 4.6 *NR  > 3 14 cm-Ankle 14    Left Sural Sensory  Calf-Lat mall *NR  < 4.6 *NR  > 3 Calf-Lat mall 14    Left Ulnar Sensory  Wrist-Dig V *3.9  < 3.2 7  > 5 Wrist-Dig V 11     H-Reflex Results    M-Lat H Lat H Neg Amp H-M Lat  Site (ms) (ms) Norm (mV) (ms)  Left Tibial H-Reflex  Pop fossa 8.8 34.9  < 35.0 3.4 26.1   Electromyography   Side Muscle Ins.Act Fibs Fasc Recrt Amp Dur Poly Activation Comment  Left Tib ant Nml Nml *1+ *2- *1+ *1+ *1+ Nml N/A  Left Gastroc MH *1+ Nml Nml *1- *1+ *1+ *1+ Nml N/A  Left Rectus fem Nml Nml Nml Nml Nml Nml Nml Nml N/A  Left Biceps fem SH Nml Nml Nml *2- *1+ *1+ *2+ Nml N/A  Left Gluteus med Nml Nml Nml Nml Nml Nml Nml Nml N/A  Left L5 PSP Nml Nml Nml *1- *1+ *1+ *1+ Nml N/A   Left FDI Nml Nml Nml Nml Nml Nml Nml Nml N/A  Left EIP Nml Nml Nml Nml Nml Nml Nml Nml N/A  Left ADM Nml Nml Nml Nml Nml Nml Nml Nml N/A  Left Pronator teres Nml Nml Nml *2- *1+ *1+ *1+ Nml N/A  Left Biceps Nml Nml Nml Nml Nml Nml Nml Nml N/A  Left Triceps lat hd Nml Nml Nml *1- *1+ *1+ *1+ Nml N/A  Left Deltoid Nml Nml Nml Nml Nml Nml Nml Nml N/A  Left C7 PSP Nml Nml Nml *1- *1+ *1+ *1+ Nml N/A      Waveforms:  Motor           Sensory             H-Reflex

## 2022-06-20 NOTE — Progress Notes (Signed)
Tried calling patient no answer. Mailbox full, Unable to LVM.

## 2022-06-24 NOTE — Progress Notes (Signed)
Tried calling patient no answer. VM full, Unable to LVM.

## 2022-07-16 ENCOUNTER — Ambulatory Visit: Payer: Medicare PPO | Admitting: Orthopaedic Surgery

## 2022-07-17 ENCOUNTER — Encounter: Payer: Self-pay | Admitting: Orthopaedic Surgery

## 2022-07-17 ENCOUNTER — Ambulatory Visit: Payer: No Typology Code available for payment source | Admitting: Orthopaedic Surgery

## 2022-07-17 DIAGNOSIS — M25562 Pain in left knee: Secondary | ICD-10-CM

## 2022-07-17 DIAGNOSIS — G8929 Other chronic pain: Secondary | ICD-10-CM | POA: Diagnosis not present

## 2022-07-17 NOTE — Addendum Note (Signed)
Addended by: Wendi Maya on: 07/17/2022 08:56 AM   Modules accepted: Orders

## 2022-07-17 NOTE — Progress Notes (Signed)
   Office Visit Note   Patient: Schylar Wuebker           Date of Birth: 12/03/1950           MRN: [id number] Visit Date: 07/17/2022              [facility]ed by: [facility] [address] [place],  [place] [address] [facility]: [facility]   Assessment & Plan: Visit Diagnoses:  1. Chronic pain of left knee     Plan: Impression is chronic left knee pain after sustaining mechanical fall following CVA with left-sided weakness.  At this point, the patient is undergone cortisone injection with only temporary relief.  He continues to endorse pain to the medial aspect of the knee with associated locking.  I would like to go ahead and order an MRI to assess for meniscal pathology.  He will follow-up with [place] once this is been completed.  Call with concerns or questions in the meantime.  Follow-Up Instructions: Return for f/u after MRI.   Orders:  No orders of the defined types were placed in this encounter.  No orders of the defined types were placed in this encounter.     Procedures: No procedures performed   Clinical Data: No additional findings.   Subjective: Chief Complaint  Patient presents with   Left Knee - Follow-up    HPI patient is a pleasant 72 year old gentleman who comes in today with recurrent left knee pain.  This began after a fall which he sustained following a CVA several months back.  He has had pain and weakness to the left leg since.  Majority of his pain is to the anteromedial aspect.  This is constant but worse with walking.  He notes occasional locking sensation to the left knee.  He has been taking meloxicam without relief.  He was seen in our office about 1 month ago where cortisone injection was performed.  This helped but unfortunately I only lasted for a few days.  He is ambulating with a walker as he has left-sided weakness following his CVA.  Has not been to any recent physical therapy.  Review of Systems as detailed in HPI.  All others reviewed  and are negative.   Objective: Vital Signs: There were no vitals taken for this visit.  Physical Exam well-developed well-nourished gentleman in no acute distress.  Alert and oriented x 3.  Ortho Exam left knee exam shows no effusion.  Range of motion: Active 30 to 95 degrees.  I can passively get him to 0 degrees of extension.  Medial joint line tenderness.  No patellofemoral crepitus.  Ligaments are stable.  He is neurovascularly intact distally.  Specialty Comments:  No specialty comments available.  Imaging: No new imaging   PMFS History: There are no problems to display for this patient.  Past Medical History:  Diagnosis Date   Diabetes mellitus without complication    Hypertension     No family history on file.  No past surgical history on file. Social History   Occupational History   Not on file  Tobacco Use   Smoking status: Never   Smokeless tobacco: Current    Types: Chew  Substance and Sexual Activity   Alcohol use: No   Drug use: No   Sexual activity: Not on file

## 2022-08-28 ENCOUNTER — Ambulatory Visit
Admission: RE | Admit: 2022-08-28 | Discharge: 2022-08-28 | Disposition: A | Discharge status: Home / Self Care | Payer: Medicare HMO | Source: Ambulatory Visit | Attending: Orthopaedic Surgery | Admitting: Orthopaedic Surgery

## 2022-08-28 DIAGNOSIS — M25562 Pain in left knee: Secondary | ICD-10-CM | POA: Diagnosis not present

## 2022-08-28 DIAGNOSIS — G8929 Other chronic pain: Secondary | ICD-10-CM

## 2022-08-28 DIAGNOSIS — M25462 Effusion, left knee: Secondary | ICD-10-CM | POA: Diagnosis not present

## 2022-08-28 DIAGNOSIS — M23332 Other meniscus derangements, other medial meniscus, left knee: Secondary | ICD-10-CM | POA: Diagnosis not present

## 2022-08-28 DIAGNOSIS — M1712 Unilateral primary osteoarthritis, left knee: Secondary | ICD-10-CM | POA: Diagnosis not present

## 2022-09-07 NOTE — Progress Notes (Signed)
Needs follow up appt.  Thanks.

## 2022-09-10 ENCOUNTER — Telehealth: Payer: Self-pay | Admitting: Orthopaedic Surgery

## 2022-09-10 NOTE — Telephone Encounter (Signed)
Called patient got recording mailbox is full. Called Eunika Smalls (Contact listed)   she advised will have him call me tomorrow

## 2022-09-11 ENCOUNTER — Telehealth: Payer: Self-pay | Admitting: Orthopaedic Surgery

## 2022-09-11 NOTE — Telephone Encounter (Signed)
Called patient again got recording mailbox is full     could not leave message

## 2022-09-23 NOTE — Progress Notes (Unsigned)
   Office Visit Note   Patient: Luis Hawkins           Date of Birth: 1950-05-04           MRN: [id number] Visit Date: 09/24/2022              Requested by: [facility] [address] [place],  [place] [address] PCP: [facility]   Assessment & Plan: Visit Diagnoses:  1. Acute medial meniscus tear of left knee, initial encounter     Plan: Luis Hawkins is a 72 year old gentleman with medial meniscal tear found on recent left knee MRI scan.  Based on these findings I have recommended arthroscopic partial medial meniscectomy and chondroplasty.  Luis Hawkins will call the patient to confirm surgery time.  Follow-Up Instructions: No follow-ups on file.   Orders:  No orders of the defined types were placed in this encounter.  No orders of the defined types were placed in this encounter.     Procedures: No procedures performed   Clinical Data: No additional findings.   Subjective: Chief Complaint  Patient presents with   Left Knee - Pain    HPI Luis Hawkins returns today to discuss MRI results. Review of Systems  Constitutional: Negative.   HENT: Negative.    Eyes: Negative.   Respiratory: Negative.    Cardiovascular: Negative.   Gastrointestinal: Negative.   Endocrine: Negative.   Genitourinary: Negative.   Skin: Negative.   Allergic/Immunologic: Negative.   Neurological: Negative.   Hematological: Negative.   Psychiatric/Behavioral: Negative.    All other systems reviewed and are negative.    Objective: Vital Signs: There were no vitals taken for this visit.  Physical Exam Vitals and nursing note reviewed.  Constitutional:      Appearance: He is well-developed.  Pulmonary:     Effort: Pulmonary effort is normal.  Abdominal:     Palpations: Abdomen is soft.  Skin:    General: Skin is warm.  Neurological:     Mental Status: He is alert and oriented to person, place, and time.  Psychiatric:        Behavior: Behavior normal.        Thought  Content: Thought content normal.        Judgment: Judgment normal.     Ortho Exam Examination of the left knee is unchanged. Specialty Comments:  No specialty comments available.  Imaging: No results found.   PMFS History: There are no problems to display for this patient.  Past Medical History:  Diagnosis Date   Diabetes mellitus without complication ([facility])    Hypertension     No family history on file.  No past surgical history on file. Social History   Occupational History   Not on file  Tobacco Use   Smoking status: Never   Smokeless tobacco: Current    Types: Chew  Substance and Sexual Activity   Alcohol use: No   Drug use: No   Sexual activity: Not on file

## 2022-09-24 ENCOUNTER — Ambulatory Visit (INDEPENDENT_AMBULATORY_CARE_PROVIDER_SITE_OTHER): Payer: No Typology Code available for payment source | Admitting: Orthopaedic Surgery

## 2022-09-24 DIAGNOSIS — S83242A Other tear of medial meniscus, current injury, left knee, initial encounter: Secondary | ICD-10-CM

## 2022-10-21 ENCOUNTER — Other Ambulatory Visit: Payer: Self-pay | Admitting: Physician Assistant

## 2022-10-21 MED ORDER — HYDROCODONE-ACETAMINOPHEN 5-325 MG PO TABS: 1.0000 | ORAL_TABLET | Freq: Three times a day (TID) | ORAL | 0 refills | Status: DC | PRN

## 2022-10-21 MED ORDER — ONDANSETRON HCL 4 MG PO TABS: 4.0000 mg | ORAL_TABLET | Freq: Three times a day (TID) | ORAL | 0 refills | Status: AC | PRN

## 2022-10-24 ENCOUNTER — Encounter: Payer: Self-pay | Admitting: Orthopaedic Surgery

## 2022-10-24 DIAGNOSIS — S83242A Other tear of medial meniscus, current injury, left knee, initial encounter: Secondary | ICD-10-CM

## 2022-10-24 DIAGNOSIS — M94262 Chondromalacia, left knee: Secondary | ICD-10-CM

## 2022-10-25 ENCOUNTER — Telehealth: Payer: Self-pay

## 2022-10-25 NOTE — Telephone Encounter (Signed)
Patient's friend, Tyrone Nine called stating that Rx for Hydrocodone that was sent to [facility] on 10/21/2022, needs to be sent to [facility] in [place].  Patient had left knee surgery.  [phone].  CB# for Tyrone Nine is [phone].  Please advise.  Thank you.

## 2022-10-28 ENCOUNTER — Other Ambulatory Visit: Payer: Self-pay | Admitting: Physician Assistant

## 2022-10-28 MED ORDER — HYDROCODONE-ACETAMINOPHEN 5-325 MG PO TABS: 1.0000 | ORAL_TABLET | Freq: Three times a day (TID) | ORAL | 0 refills | Status: AC | PRN

## 2022-10-28 NOTE — Telephone Encounter (Signed)
sent 

## 2022-11-01 ENCOUNTER — Encounter: Payer: Medicare HMO | Admitting: Physician Assistant

## 2022-11-12 ENCOUNTER — Ambulatory Visit (INDEPENDENT_AMBULATORY_CARE_PROVIDER_SITE_OTHER): Payer: Medicare PPO | Admitting: Orthopaedic Surgery

## 2022-11-12 DIAGNOSIS — S83242A Other tear of medial meniscus, current injury, left knee, initial encounter: Secondary | ICD-10-CM

## 2022-11-12 NOTE — Progress Notes (Signed)
   Post-Op Visit Note   Patient: Luis Hawkins           Date of Birth: 1950/06/13           MRN: [id number] Visit Date: 11/12/2022 [facility]: [facility]   Assessment & Plan:  Chief Complaint:  Chief Complaint  Patient presents with   Left Knee - Routine Post Op    /LEFT KNEE PMM (surgery date 10-24-22)   Visit Diagnoses:  1. Acute medial meniscus tear of left knee, initial encounter     Plan: Mr. Stofflet is approximately 3 weeks postop from left knee scope.  He is doing well overall.  He has been taking Tylenol for the pain occasionally.  Has no real complaints other than he feels he needs a little bit more strength for ambulation.  Examination of the left knee shows fully healed surgical scars.  No signs of infection.  Range of motion is progressing very nicely.  I would like to send him to outpatient PT for knee rehab particularly for gait training and strengthening.  I would like to recheck him in another 4 weeks or so.  Follow-Up Instructions: Return in about 4 weeks (around 12/10/2022) for lindsey.   Orders:  Orders Placed This Encounter  Procedures   Ambulatory referral to Physical Therapy   No orders of the defined types were placed in this encounter.   Imaging: No results found.  PMFS History: There are no problems to display for this patient.  Past Medical History:  Diagnosis Date   Diabetes mellitus without complication ([facility])    Hypertension     No family history on file.  No past surgical history on file. Social History   Occupational History   Not on file  Tobacco Use   Smoking status: Never   Smokeless tobacco: Current    Types: Chew  Substance and Sexual Activity   Alcohol use: No   Drug use: No   Sexual activity: Not on file

## 2022-11-20 ENCOUNTER — Other Ambulatory Visit: Payer: Self-pay

## 2022-11-20 ENCOUNTER — Ambulatory Visit: Payer: Medicare PPO | Admitting: Physical Therapy

## 2022-11-20 ENCOUNTER — Ambulatory Visit (INDEPENDENT_AMBULATORY_CARE_PROVIDER_SITE_OTHER): Payer: Medicare PPO | Admitting: Physical Therapy

## 2022-11-20 ENCOUNTER — Encounter: Payer: Self-pay | Admitting: Physical Therapy

## 2022-11-20 DIAGNOSIS — M6281 Muscle weakness (generalized): Secondary | ICD-10-CM

## 2022-11-20 DIAGNOSIS — R2689 Other abnormalities of gait and mobility: Secondary | ICD-10-CM

## 2022-11-20 DIAGNOSIS — R600 Localized edema: Secondary | ICD-10-CM | POA: Diagnosis not present

## 2022-11-20 DIAGNOSIS — M25562 Pain in left knee: Secondary | ICD-10-CM | POA: Diagnosis not present

## 2022-11-20 DIAGNOSIS — M25662 Stiffness of left knee, not elsewhere classified: Secondary | ICD-10-CM

## 2022-11-20 NOTE — Therapy (Signed)
OUTPATIENT PHYSICAL THERAPY EVALUATION   Patient Name: Luis Hawkins MRN: [id number] DOB:04-Mar-1951, 72 y.o., male Today's Date: 11/20/2022  END OF SESSION:  PT End of Session - 11/20/22 1329     Visit Number 1    Number of Visits 12    Date for PT Re-Evaluation 01/01/23    Authorization Type [employer] $25 copay    Progress Note Due on Visit 10    PT Start Time 1330    PT Stop Time 1415    PT Time Calculation (min) 45 min    Activity Tolerance Patient tolerated treatment well    Behavior During Therapy WFL for tasks assessed/performed             Past Medical History:  Diagnosis Date   Diabetes mellitus without complication (HCC)    Hypertension    History reviewed. No pertinent surgical history. There are no problems to display for this patient.   PCP: [facility]  REFERRING PROVIDER: Tarry Kos, MD  REFERRING DIAG: [phone] (ICD-10-CM) - Acute medial meniscus tear of left knee, initial encounter  Rationale for Evaluation and Treatment: Rehabilitation  THERAPY DIAG:  Acute pain of left knee - Plan: PT plan of care cert/re-cert  Stiffness of left knee, not elsewhere classified - Plan: PT plan of care cert/re-cert  Muscle weakness (generalized) - Plan: PT plan of care cert/re-cert  Localized edema - Plan: PT plan of care cert/re-cert  Other abnormalities of gait and mobility - Plan: PT plan of care cert/re-cert  ONSET DATE: 10/24/22 (DOS)   SUBJECTIVE:                                                                                                                                                                                           SUBJECTIVE STATEMENT: Pt is s/p Lt knee scope with meniscectomy on 10/24/22.  He c/o continued pain in knee since surgery, and some balance deficits since surgery.  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  PERTINENT HISTORY:  DM, HTN, hx CVA (3 months ago), ?dementia  PAIN:  Are you having  pain? Yes: NPRS scale: 6 currently, up to 8, at best 6/10 Pain location: Lt knee Pain description: sharp, shooting Aggravating factors: walking, "when it gets stiff" Relieving factors: medication, ice  PRECAUTIONS:  None  RED FLAGS: None   WEIGHT BEARING RESTRICTIONS:  No  FALLS:  Has patient fallen in last 6 months? No  LIVING ENVIRONMENT: Lives with: lives alone and has caregiver 30 hours a week (4 hours Sun-Fri; 6 hours Sat) Lives in: House/apartment Stairs: No; ramped entrance Has following  equipment at home: Single point cane, Environmental consultant - 2 wheeled, and Wheelchair (manual)  OCCUPATION:  Retired from [employer]  PLOF:  Independent with basic ADLs, Leisure: TV and golf, occasional walking, and caregiver provides some light housekeeping, companion, meal prep, medication  PATIENT GOALS:  Improve mobility in Lt knee and improve balance   OBJECTIVE:   DIAGNOSTIC FINDINGS:  MRI: meniscus tear  PATIENT SURVEYS:  11/20/22 FOTO 22 (predicted 44)  COGNITIVE STATUS: History of cognitive impairments - at baseline   SENSATION: WFL  POSTURE:  rounded shoulders and forward head  GAIT: 11/20/22 Distance walked: 100' Assistive device utilized: Single point cane Level of assistance: SBA Comments: Lt foot drop with genu recurvatum, decreased DF bil; ataxia, trendelenburg on Lt   LOWER EXTREMITY ROM:     ROM Right eval Left eval  Knee flexion  A: 44 P: 143  Knee extension   A: -35 P: 0   (Blank rows = not tested) LOWER EXTREMITY MMT:    MMT Right eval Left eval  Knee flexion  2+/5  Knee extension  2+/5   (Blank rows = not tested)    FUNCTIONAL TESTS:  11/20/22 5 times sit to stand: 30.53 sec with UE support needed Timed up and go (TUG): 16.56 sec with SPC   TREATMENT:                                                                                                                              DATE:  11/20/22 See HEP - demonstrated with trial reps PRN for  comprehension     PATIENT EDUCATION:  Education details: HEP Person educated: Patient and Caregiver   Education method: Explanation, Facilities manager, and Handouts Education comprehension: verbalized understanding, returned demonstration, and needs further education  HOME EXERCISE PROGRAM: Access Code: [email or web address] URL: [email or web address] Date: 11/20/2022 Prepared by: Moshe Cipro  Exercises - Sit to Stand  - 2 x daily - 7 x weekly - 1 sets - 10 reps - Seated Long Arc Quad  - 2 x daily - 7 x weekly - 1 sets - 10 reps - Seated Hamstring Set  - 2 x daily - 7 x weekly - 1 sets - 10 reps - 5 sec hold - Seated March  - 2 x daily - 7 x weekly - 1 sets - 10 reps - Supine Bridge  - 2 x daily - 7 x weekly - 1 sets - 10 reps - 5 sec hold  Patient Education - Attendance Policy   ASSESSMENT:  CLINICAL IMPRESSION: Patient is a 72 y.o. male who was seen today for physical therapy evaluation and treatment s/p Lt knee scope with meniscectomy. He demonstrates decreased strength and gait abnormalities with expected post op pain affecting functional mobility.  affecting functional mobility.  He will benefit from PT to address deficits listed.    OBJECTIVE IMPAIRMENTS: Abnormal gait, decreased balance, decreased cognition, decreased knowledge of use of DME, decreased mobility, difficulty walking, decreased ROM, decreased  strength, and pain.   ACTIVITY LIMITATIONS: carrying, lifting, bending, standing, transfers, bed mobility, and locomotion level  PARTICIPATION LIMITATIONS: cleaning, driving, and community activity  PERSONAL FACTORS: 1-2 comorbidities: HTN, DM  are also affecting patient's functional outcome.   REHAB POTENTIAL: Good  CLINICAL DECISION MAKING: Evolving/moderate complexity  EVALUATION COMPLEXITY: Moderate   GOALS: Goals reviewed with patient? Yes  SHORT TERM GOALS: Target date: 12/11/2022  Independent with initial HEP Goal status: INITIAL  2.  Lt knee  AROM improved 0-15-90 deg for improved strength Goal status: INITIAL   LONG TERM GOALS: Target date: 01/01/2023   Independent with final HEP Goal status: INITIAL  2.  FOTO score improved to 44 Goal status: INITIAL  3.  Lt knee AROM improved to 0-120 for improved strength Goal status: INIITAL  4.  Report pain < 3/10 with standing and walking for improved function Goal status: INITIAL  5.  Improve 5x STS to < 20 sec for improved strength and balance Goal status: INITIAL  6.  Improve Timed up and go to < 14 sec for improved mobility Goal status: INITIAL     PLAN:  PT FREQUENCY: 1-2x/week  PT DURATION: 6 weeks  PLANNED INTERVENTIONS: Therapeutic exercises, Therapeutic activity, Neuromuscular re-education, Balance training, Gait training, Patient/Family education, Self Care, Joint mobilization, Stair training, Orthotic/Fit training, DME instructions, Electrical stimulation, Cryotherapy, Moist heat, Taping, Vasopneumatic device, Ionotophoresis 4mg /ml Dexamethasone, Manual therapy, and Re-evaluation.  PLAN FOR NEXT SESSION: review HEP, try AFO on Lt to see if it helps with mobility, review HEP   NEXT MD VISIT: Not scheduled   Clarita Crane, PT, DPT 11/20/22 2:28 PM   Referring diagnosis? Z61.096E (ICD-10-CM) - Acute medial meniscus tear of left knee, initial encounter Treatment diagnosis? (if different than referring diagnosis) M25.562, M25.662, M62.81, R60.0, R26.89 What was this (referring dx) caused by? [x]  Surgery []  Fall []  Ongoing issue []  Arthritis []  Other: ____________  Laterality: []  Rt [x]  Lt []  Both  Check all possible CPT codes:  *CHOOSE 10 OR LESS*    []  97110 (Therapeutic Exercise)  []  92507 (SLP Treatment)  []  97112 (Neuro Re-ed)   []  92526 (Swallowing Treatment)   []  97116 (Gait Training)   []  [id number] (Cognitive Training, 1st 15 minutes) []  97140 (Manual Therapy)   []  97130 (Cognitive Training, each add'l 15 minutes)  []  97164  (Re-evaluation)                              []  Other, List CPT Code ____________  []  97530 (Therapeutic Activities)     []  97535 (Self Care)   [x]  All codes above (97110 - 97535)  []  97012 (Mechanical Traction)  []  97014 (E-stim Unattended)  []  97032 (E-stim manual)  []  97033 (Ionto)  []  97035 (Ultrasound) [x]  97750 (Physical Performance Training) []  [id number] (Aquatic Therapy) []  97016 (Vasopneumatic Device) []  [id number] (Paraffin) []  97034 (Contrast Bath) []  97597 (Wound Care 1st 20 sq cm) []  45409 (Wound Care each add'l 20 sq cm) [x]  97760 (Orthotic Fabrication, Fitting, Training Initial) []  [id number] (Prosthetic Management and Training Initial) [x]  [id number] (Orthotic or Prosthetic Training/ Modification Subsequent)

## 2022-11-22 ENCOUNTER — Ambulatory Visit (INDEPENDENT_AMBULATORY_CARE_PROVIDER_SITE_OTHER): Payer: Medicare PPO | Admitting: Physical Therapy

## 2022-11-22 ENCOUNTER — Encounter: Payer: Self-pay | Admitting: Physical Therapy

## 2022-11-22 DIAGNOSIS — M6281 Muscle weakness (generalized): Secondary | ICD-10-CM

## 2022-11-22 DIAGNOSIS — R600 Localized edema: Secondary | ICD-10-CM

## 2022-11-22 DIAGNOSIS — M25662 Stiffness of left knee, not elsewhere classified: Secondary | ICD-10-CM

## 2022-11-22 DIAGNOSIS — R2689 Other abnormalities of gait and mobility: Secondary | ICD-10-CM | POA: Diagnosis not present

## 2022-11-22 DIAGNOSIS — M25562 Pain in left knee: Secondary | ICD-10-CM

## 2022-11-22 NOTE — Therapy (Signed)
OUTPATIENT PHYSICAL THERAPY EVALUATION   Patient Name: Luis Hawkins MRN: [id number] DOB:June 12, 1950, 72 y.o., male Today's Date: 11/22/2022  END OF SESSION:  PT End of Session - 11/22/22 1038     Visit Number 2    Number of Visits 12    Date for PT Re-Evaluation 01/01/23    Authorization Type [employer] $25 copay    Progress Note Due on Visit 10    PT Start Time 1010    PT Stop Time 1050    PT Time Calculation (min) 40 min    Activity Tolerance Patient tolerated treatment well    Behavior During Therapy WFL for tasks assessed/performed              Past Medical History:  Diagnosis Date   Diabetes mellitus without complication (HCC)    Hypertension    History reviewed. No pertinent surgical history. There are no problems to display for this patient.   PCP: [facility]  REFERRING PROVIDER: Tarry Kos, MD  REFERRING DIAG: [phone] (ICD-10-CM) - Acute medial meniscus tear of left knee, initial encounter  Rationale for Evaluation and Treatment: Rehabilitation  THERAPY DIAG:  Acute pain of left knee  Stiffness of left knee, not elsewhere classified  Muscle weakness (generalized)  Localized edema  Other abnormalities of gait and mobility  ONSET DATE: 10/24/22 (DOS)   SUBJECTIVE:                                                                                                                                                                                           SUBJECTIVE STATEMENT: Pain is about a 7 today, just medial and distal to patella.    PERTINENT HISTORY:  DM, HTN, hx CVA (3 months ago), ?dementia  PAIN:  Are you having pain? Yes: NPRS scale: 6 currently, up to 8, at best 6/10 Pain location: Lt knee Pain description: sharp, shooting Aggravating factors: walking, "when it gets stiff" Relieving factors: medication, ice  PRECAUTIONS:  None  RED FLAGS: None   WEIGHT BEARING RESTRICTIONS:  No  FALLS:  Has patient fallen in last 6  months? No  LIVING ENVIRONMENT: Lives with: lives alone and has caregiver 30 hours a week (4 hours Sun-Fri; 6 hours Sat) Lives in: House/apartment Stairs: No; ramped entrance Has following equipment at home: Single point cane, Environmental consultant - 2 wheeled, and Wheelchair (manual)  OCCUPATION:  Retired from [employer]  PLOF:  Independent with basic ADLs, Leisure: TV and golf, occasional walking, and caregiver provides some light housekeeping, companion, meal prep, medication  PATIENT GOALS:  Improve mobility in Lt knee and improve balance   OBJECTIVE:  DIAGNOSTIC FINDINGS:  MRI: meniscus tear  PATIENT SURVEYS:  11/22/22 FOTO 22 (predicted 44)  COGNITIVE STATUS: History of cognitive impairments - at baseline   SENSATION: WFL  POSTURE:  rounded shoulders and forward head  GAIT: 11/20/22 Distance walked: 100' Assistive device utilized: Single point cane Level of assistance: SBA Comments: Lt foot drop with genu recurvatum, decreased DF bil; ataxia, trendelenburg on Lt   LOWER EXTREMITY ROM:     ROM Right eval Left eval  Knee flexion  A: 44 P: 143  Knee extension   A: -35 P: 0   (Blank rows = not tested) LOWER EXTREMITY MMT:    MMT Right eval Left eval  Knee flexion  2+/5  Knee extension  2+/5   (Blank rows = not tested)    FUNCTIONAL TESTS:  11/20/22 5 times sit to stand: 30.53 sec with UE support needed Timed up and go (TUG): 16.56 sec with SPC   TREATMENT:                                                                                                                              DATE:  11/22/22 TherEx Seated LAQ on Lt 2x10; 5 sec hold Seated hamstring set 2x10; 5 sec hold - tactile feedback needed for correct technique NuStep LEs only L6 x 10 min Leg Press 75# bil push 2x10   Orthotic Training Trial of anterior and posterior support AFOs with 1/4" heel lift to determine most appropriate AFO for pt.  Pt still with some recurvatum and toe drag but improved  with both, more with anterior support.  Feel with proper fitting (both trial AFOs too small in clinic) this will be best option for pt for safer mobility.  Amb with SPC and minguard A with AFOs  11/20/22 See HEP - demonstrated with trial reps PRN for comprehension     PATIENT EDUCATION:  Education details: HEP Person educated: Patient and Caregiver   Education method: Explanation, Demonstration, and Handouts Education comprehension: verbalized understanding, returned demonstration, and needs further education  HOME EXERCISE PROGRAM: Access Code: [email or web address] URL: [email or web address] Date: 11/20/2022 Prepared by: Moshe Cipro  Exercises - Sit to Stand  - 2 x daily - 7 x weekly - 1 sets - 10 reps - Seated Long Arc Quad  - 2 x daily - 7 x weekly - 1 sets - 10 reps - Seated Hamstring Set  - 2 x daily - 7 x weekly - 1 sets - 10 reps - 5 sec hold - Seated March  - 2 x daily - 7 x weekly - 1 sets - 10 reps - Supine Bridge  - 2 x daily - 7 x weekly - 1 sets - 10 reps - 5 sec hold  Patient Education - Attendance Policy   ASSESSMENT:  CLINICAL IMPRESSION: Pt tolerated session well today but does need mod cues for HEP review for exercises reviewed today.  Trial of AFO with ant support and heel  lift recommended.  He will benefit from PT to address deficits listed.    OBJECTIVE IMPAIRMENTS: Abnormal gait, decreased balance, decreased cognition, decreased knowledge of use of DME, decreased mobility, difficulty walking, decreased ROM, decreased strength, and pain.   ACTIVITY LIMITATIONS: carrying, lifting, bending, standing, transfers, bed mobility, and locomotion level  PARTICIPATION LIMITATIONS: cleaning, driving, and community activity  PERSONAL FACTORS: 1-2 comorbidities: HTN, DM  are also affecting patient's functional outcome.   REHAB POTENTIAL: Good  CLINICAL DECISION MAKING: Evolving/moderate complexity  EVALUATION COMPLEXITY: Moderate   GOALS: Goals  reviewed with patient? Yes  SHORT TERM GOALS: Target date: 12/11/2022  Independent with initial HEP Goal status: INITIAL  2.  Lt knee AROM improved 0-15-90 deg for improved strength Goal status: INITIAL   LONG TERM GOALS: Target date: 01/01/2023   Independent with final HEP Goal status: INITIAL  2.  FOTO score improved to 44 Goal status: INITIAL  3.  Lt knee AROM improved to 0-120 for improved strength Goal status: INIITAL  4.  Report pain < 3/10 with standing and walking for improved function Goal status: INITIAL  5.  Improve 5x STS to < 20 sec for improved strength and balance Goal status: INITIAL  6.  Improve Timed up and go to < 14 sec for improved mobility Goal status: INITIAL     PLAN:  PT FREQUENCY: 1-2x/week  PT DURATION: 6 weeks  PLANNED INTERVENTIONS: Therapeutic exercises, Therapeutic activity, Neuromuscular re-education, Balance training, Gait training, Patient/Family education, Self Care, Joint mobilization, Stair training, Orthotic/Fit training, DME instructions, Electrical stimulation, Cryotherapy, Moist heat, Taping, Vasopneumatic device, Ionotophoresis 4mg /ml Dexamethasone, Manual therapy, and Re-evaluation.  PLAN FOR NEXT SESSION: continue HEP review, strengthening and balance, may need BERG   NEXT MD VISIT: Not scheduled   Clarita Crane, PT, DPT 11/22/22 10:51 AM

## 2022-11-27 ENCOUNTER — Telehealth: Payer: Self-pay | Admitting: Physical Therapy

## 2022-11-27 ENCOUNTER — Encounter: Payer: Medicare PPO | Admitting: Physical Therapy

## 2022-11-27 ENCOUNTER — Encounter: Payer: Self-pay | Admitting: Physical Therapy

## 2022-11-27 NOTE — Telephone Encounter (Signed)
PT called due to pt missed PT appt.  Left message on his mobile number with next appt, opening at 11 this morning and call back number to cancel prn.

## 2022-11-28 ENCOUNTER — Encounter: Payer: Self-pay | Admitting: Rehabilitative and Restorative Service Providers"

## 2022-11-28 ENCOUNTER — Ambulatory Visit (INDEPENDENT_AMBULATORY_CARE_PROVIDER_SITE_OTHER): Payer: Medicare PPO | Admitting: Rehabilitative and Restorative Service Providers"

## 2022-11-28 DIAGNOSIS — R2689 Other abnormalities of gait and mobility: Secondary | ICD-10-CM

## 2022-11-28 DIAGNOSIS — M6281 Muscle weakness (generalized): Secondary | ICD-10-CM | POA: Diagnosis not present

## 2022-11-28 DIAGNOSIS — M25562 Pain in left knee: Secondary | ICD-10-CM

## 2022-11-28 DIAGNOSIS — R600 Localized edema: Secondary | ICD-10-CM | POA: Diagnosis not present

## 2022-11-28 DIAGNOSIS — M25662 Stiffness of left knee, not elsewhere classified: Secondary | ICD-10-CM | POA: Diagnosis not present

## 2022-11-28 NOTE — Therapy (Signed)
OUTPATIENT PHYSICAL THERAPY TREATMENT   Patient Name: Luis Hawkins MRN: [id number] DOB:March 31, 1950, 72 y.o., male Today's Date: 11/28/2022  END OF SESSION:  PT End of Session - 11/28/22 1515     Visit Number 3    Number of Visits 12    Date for PT Re-Evaluation 01/01/23    Authorization Type [employer] $25 copay    Progress Note Due on Visit 10    PT Start Time 1432    PT Stop Time 1517    PT Time Calculation (min) 45 min    Activity Tolerance Patient tolerated treatment well    Behavior During Therapy WFL for tasks assessed/performed               Past Medical History:  Diagnosis Date   Diabetes mellitus without complication (HCC)    Hypertension    History reviewed. No pertinent surgical history. There are no problems to display for this patient.   PCP: [facility]  REFERRING PROVIDER: Tarry Kos, MD  REFERRING DIAG: [phone] (ICD-10-CM) - Acute medial meniscus tear of left knee, initial encounter  Rationale for Evaluation and Treatment: Rehabilitation  THERAPY DIAG:  Acute pain of left knee  Muscle weakness (generalized)  Stiffness of left knee, not elsewhere classified  Localized edema  Other abnormalities of gait and mobility  ONSET DATE: 10/24/22 (DOS)   SUBJECTIVE:                                                                                                                                                                                           SUBJECTIVE STATEMENT: [name] reports compliance with his HEP.  Medial knee pain limits his WB function.  PERTINENT HISTORY:  DM, HTN, hx CVA (3 months ago), ?dementia  PAIN:  Are you having pain?   Yes: NPRS scale: 6-8/10 Pain location: Lt knee Pain description: sharp, shooting Aggravating factors: walking, "when it gets stiff" Relieving factors: medication, ice  PRECAUTIONS:  None  RED FLAGS: None   WEIGHT BEARING RESTRICTIONS:  No  FALLS:  Has patient fallen in last 6 months?  No  LIVING ENVIRONMENT: Lives with: lives alone and has caregiver 30 hours a week (4 hours Sun-Fri; 6 hours Sat) Lives in: House/apartment Stairs: No; ramped entrance Has following equipment at home: Single point cane, Environmental consultant - 2 wheeled, and Wheelchair (manual)  OCCUPATION:  Retired from [employer]  PLOF:  Independent with basic ADLs, Leisure: TV and golf, occasional walking, and caregiver provides some light housekeeping, companion, meal prep, medication  PATIENT GOALS:  Improve mobility in Lt knee and improve balance   OBJECTIVE:   DIAGNOSTIC FINDINGS:  MRI:  meniscus tear  PATIENT SURVEYS:  11/28/22 FOTO 22 (predicted 44)  COGNITIVE STATUS: History of cognitive impairments - at baseline   SENSATION: WFL  POSTURE:  rounded shoulders and forward head  GAIT: 11/20/22 Distance walked: 100' Assistive device utilized: Single point cane Level of assistance: SBA Comments: Lt foot drop with genu recurvatum, decreased DF bil; ataxia, trendelenburg on Lt   LOWER EXTREMITY ROM:     ROM Right eval Left eval  Knee flexion  A: 44 P: 143  Knee extension   A: -35 P: 0   (Blank rows = not tested) LOWER EXTREMITY MMT:    MMT Right eval Left eval  Knee flexion  2+/5  Knee extension  2+/5   (Blank rows = not tested)    FUNCTIONAL TESTS:  11/20/22 5 times sit to stand: 30.53 sec with UE support needed Timed up and go (TUG): 16.56 sec with SPC   TREATMENT:                                                                                                                              DATE:  11/27/2022: Quadriceps sets with 2 pillows under left knee 2 sets of 10 for 5 seconds Bridgeing 10 x 5 seconds Seated marching 10 x Seated hamstrings sets 10 x 3seconds Seated LAQ 10 x 3 seconds Sit to stand with UE support and slow eccentrics 5 x  Vaso left knee 5 minutes Medium Pressure 34*   11/22/22 TherEx Seated LAQ on Lt 2x10; 5 sec hold Seated hamstring set 2x10; 5 sec  hold - tactile feedback needed for correct technique NuStep LEs only L6 x 10 min Leg Press 75# bil push 2x10   Orthotic Training Trial of anterior and posterior support AFOs with 1/4" heel lift to determine most appropriate AFO for pt.  Pt still with some recurvatum and toe drag but improved with both, more with anterior support.  Feel with proper fitting (both trial AFOs too small in clinic) this will be best option for pt for safer mobility.  Amb with SPC and minguard A with AFOs  11/20/22 See HEP - demonstrated with trial reps PRN for comprehension     PATIENT EDUCATION:  Education details: HEP Person educated: Patient and Caregiver   Education method: Explanation, Demonstration, and Handouts Education comprehension: verbalized understanding, returned demonstration, and needs further education  HOME EXERCISE PROGRAM: Access Code: [email or web address] URL: [email or web address] Date: 11/20/2022 Prepared by: Moshe Cipro  Exercises - Sit to Stand  - 2 x daily - 7 x weekly - 1 sets - 10 reps - Seated Long Arc Quad  - 2 x daily - 7 x weekly - 1 sets - 10 reps - Seated Hamstring Set  - 2 x daily - 7 x weekly - 1 sets - 10 reps - 5 sec hold - Seated March  - 2 x daily - 7 x weekly - 1 sets - 10 reps - Supine Bridge  -  2 x daily - 7 x weekly - 1 sets - 10 reps - 5 sec hold  Patient Education - Attendance Policy   ASSESSMENT:  CLINICAL IMPRESSION: Quadriceps strengthening will be a high priority with [name]'s supervised physical therapy.  His complaints of medial knee pain are consistent with his post-surgical status and quadriceps weakness.  We reviewed his home exercise program and I added a quadriceps sets exercise and asked him to try to get as close to 100 a day as he possibly can.  Activating his quadriceps to function as the primary shock absorber of his knee joint will be a high priority with his current rehabilitation.   Continue supervised physical therapy to meet  long-term goals.  OBJECTIVE IMPAIRMENTS: Abnormal gait, decreased balance, decreased cognition, decreased knowledge of use of DME, decreased mobility, difficulty walking, decreased ROM, decreased strength, and pain.   ACTIVITY LIMITATIONS: carrying, lifting, bending, standing, transfers, bed mobility, and locomotion level  PARTICIPATION LIMITATIONS: cleaning, driving, and community activity  PERSONAL FACTORS: 1-2 comorbidities: HTN, DM  are also affecting patient's functional outcome.   REHAB POTENTIAL: Good  CLINICAL DECISION MAKING: Evolving/moderate complexity  EVALUATION COMPLEXITY: Moderate   GOALS: Goals reviewed with patient? Yes  SHORT TERM GOALS: Target date: 12/11/2022  Independent with initial HEP Goal status: Ongoing 11/28/2022  2.  Lt knee AROM improved 0-15-90 deg for improved strength Goal status: Ongoing 11/27/2022   LONG TERM GOALS: Target date: 01/01/2023   Independent with final HEP Goal status: Ongoing 11/27/2022  2.  FOTO score improved to 44 Goal status: Ongoing 11/27/2022  3.  Lt knee AROM improved to 0-120 for improved strength Goal status: Ongoing 11/27/2022  4.  Report pain < 3/10 with standing and walking for improved function Goal status: Ongoing 11/28/2022  5.  Improve 5x STS to < 20 sec for improved strength and balance Goal status: Ongoing 11/27/2022  6.  Improve Timed up and go to < 14 sec for improved mobility Goal status: Ongoing 11/27/2022     PLAN:  PT FREQUENCY: 1-2x/week  PT DURATION: 6 weeks  PLANNED INTERVENTIONS: Therapeutic exercises, Therapeutic activity, Neuromuscular re-education, Balance training, Gait training, Patient/Family education, Self Care, Joint mobilization, Stair training, Orthotic/Fit training, DME instructions, Electrical stimulation, Cryotherapy, Moist heat, Taping, Vasopneumatic device, Ionotophoresis 4mg /ml Dexamethasone, Manual therapy, and Re-evaluation.  PLAN FOR NEXT SESSION: Quadriceps strength emphasis  while avoiding over doing weight-bearing.  Berg test may be recommended as strength improves.   NEXT MD VISIT: Not scheduled    Cherlyn Cushing, PT, MPT 11/28/2022, 5:23 PM

## 2022-12-04 ENCOUNTER — Ambulatory Visit (INDEPENDENT_AMBULATORY_CARE_PROVIDER_SITE_OTHER): Payer: Medicare PPO | Admitting: Physical Therapy

## 2022-12-04 ENCOUNTER — Encounter: Payer: Self-pay | Admitting: Physical Therapy

## 2022-12-04 DIAGNOSIS — R2689 Other abnormalities of gait and mobility: Secondary | ICD-10-CM | POA: Diagnosis not present

## 2022-12-04 DIAGNOSIS — M25562 Pain in left knee: Secondary | ICD-10-CM

## 2022-12-04 DIAGNOSIS — M6281 Muscle weakness (generalized): Secondary | ICD-10-CM

## 2022-12-04 DIAGNOSIS — R600 Localized edema: Secondary | ICD-10-CM | POA: Diagnosis not present

## 2022-12-04 DIAGNOSIS — M25662 Stiffness of left knee, not elsewhere classified: Secondary | ICD-10-CM | POA: Diagnosis not present

## 2022-12-04 NOTE — Therapy (Signed)
OUTPATIENT PHYSICAL THERAPY TREATMENT   Patient Name: Luis Hawkins MRN: [id number] DOB:10-21-1950, 72 y.o., male Today's Date: 12/04/2022  END OF SESSION:  PT End of Session - 12/04/22 1426     Visit Number 4    Number of Visits 12    Date for PT Re-Evaluation 01/01/23    Authorization Type [employer] $25 copay    Authorization Time Period 12 visits; 11/20/22-01/01/23    Progress Note Due on Visit 10    PT Start Time 1427    PT Stop Time 1507    PT Time Calculation (min) 40 min    Activity Tolerance Patient tolerated treatment well    Behavior During Therapy WFL for tasks assessed/performed                Past Medical History:  Diagnosis Date   Diabetes mellitus without complication (HCC)    Hypertension    History reviewed. No pertinent surgical history. There are no problems to display for this patient.   PCP: [facility]  REFERRING PROVIDER: Tarry Kos, MD  REFERRING DIAG: [phone] (ICD-10-CM) - Acute medial meniscus tear of left knee, initial encounter  Rationale for Evaluation and Treatment: Rehabilitation  THERAPY DIAG:  Acute pain of left knee  Muscle weakness (generalized)  Stiffness of left knee, not elsewhere classified  Localized edema  Other abnormalities of gait and mobility  ONSET DATE: 10/24/22 (DOS)   SUBJECTIVE:                                                                                                                                                                                           SUBJECTIVE STATEMENT: Knee is a little stiff with a little pain especially in the morning.  PERTINENT HISTORY:  DM, HTN, hx CVA (3 months ago), ?dementia  PAIN:  Are you having pain?   Yes: NPRS scale: 7/10 Pain location: Lt knee Pain description: sharp, shooting Aggravating factors: walking, "when it gets stiff" Relieving factors: medication, ice  PRECAUTIONS:  None  RED FLAGS: None   WEIGHT BEARING RESTRICTIONS:   No  FALLS:  Has patient fallen in last 6 months? No  LIVING ENVIRONMENT: Lives with: lives alone and has caregiver 30 hours a week (4 hours Sun-Fri; 6 hours Sat) Lives in: House/apartment Stairs: No; ramped entrance Has following equipment at home: Single point cane, Environmental consultant - 2 wheeled, and Wheelchair (manual)  OCCUPATION:  Retired from [employer]  PLOF:  Independent with basic ADLs, Leisure: TV and golf, occasional walking, and caregiver provides some light housekeeping, companion, meal prep, medication  PATIENT GOALS:  Improve mobility in Lt knee and improve balance  OBJECTIVE:   DIAGNOSTIC FINDINGS:  MRI: meniscus tear  PATIENT SURVEYS:  11/20/22:  FOTO 22 (predicted 44)  COGNITIVE STATUS: History of cognitive impairments - at baseline   SENSATION: WFL  POSTURE:  rounded shoulders and forward head  GAIT: 11/20/22 Distance walked: 100' Assistive device utilized: Single point cane Level of assistance: SBA Comments: Lt foot drop with genu recurvatum, decreased DF bil; ataxia, trendelenburg on Lt   LOWER EXTREMITY ROM:     ROM Right eval Left eval  Knee flexion  A: 44 P: 143  Knee extension   A: -35 P: 0   (Blank rows = not tested) LOWER EXTREMITY MMT:    MMT Right eval Left eval  Knee flexion  2+/5  Knee extension  2+/5   (Blank rows = not tested)    FUNCTIONAL TESTS:  11/20/22 5 times sit to stand: 30.53 sec with UE support needed Timed up and go (TUG): 16.56 sec with SPC   TREATMENT:                                                                                                                              DATE:  12/04/22 TherEx NuStep L7 x 10 min Seated LAQ 2x10; 4#; LLE only Seated SLR on Lt 2x10 Sit to/from stand with intermittent UE support 2x10 BATCA knee extension 5# LLE only 2x10 BATCA hamstring curl 10# LLE only 2x10   11/27/2022: Quadriceps sets with 2 pillows under left knee 2 sets of 10 for 5 seconds Bridgeing 10 x 5  seconds Seated marching 10 x Seated hamstrings sets 10 x 3seconds Seated LAQ 10 x 3 seconds Sit to stand with UE support and slow eccentrics 5 x  Vaso left knee 5 minutes Medium Pressure 34*   11/22/22 TherEx Seated LAQ on Lt 2x10; 5 sec hold Seated hamstring set 2x10; 5 sec hold - tactile feedback needed for correct technique NuStep LEs only L6 x 10 min Leg Press 75# bil push 2x10   Orthotic Training Trial of anterior and posterior support AFOs with 1/4" heel lift to determine most appropriate AFO for pt.  Pt still with some recurvatum and toe drag but improved with both, more with anterior support.  Feel with proper fitting (both trial AFOs too small in clinic) this will be best option for pt for safer mobility.  Amb with SPC and minguard A with AFOs  11/20/22 See HEP - demonstrated with trial reps PRN for comprehension     PATIENT EDUCATION:  Education details: HEP Person educated: Patient and Caregiver   Education method: Explanation, Demonstration, and Handouts Education comprehension: verbalized understanding, returned demonstration, and needs further education  HOME EXERCISE PROGRAM: Access Code: [email or web address] URL: [email or web address] Date: 11/20/2022 Prepared by: Moshe Cipro  Exercises - Sit to Stand  - 2 x daily - 7 x weekly - 1 sets - 10 reps - Seated Long Arc Quad  - 2 x daily - 7 x weekly - 1  sets - 10 reps - Seated Hamstring Set  - 2 x daily - 7 x weekly - 1 sets - 10 reps - 5 sec hold - Seated March  - 2 x daily - 7 x weekly - 1 sets - 10 reps - Supine Bridge  - 2 x daily - 7 x weekly - 1 sets - 10 reps - 5 sec hold  Patient Education - Attendance Policy   ASSESSMENT:  CLINICAL IMPRESSION: Continued focus on strengthening today which pt tolerated well with expected fatigue.  Provided note for PCP to pursue AFO for LLE.  Will continue to benefit from PT to maximize function.  OBJECTIVE IMPAIRMENTS: Abnormal gait, decreased balance,  decreased cognition, decreased knowledge of use of DME, decreased mobility, difficulty walking, decreased ROM, decreased strength, and pain.   ACTIVITY LIMITATIONS: carrying, lifting, bending, standing, transfers, bed mobility, and locomotion level  PARTICIPATION LIMITATIONS: cleaning, driving, and community activity  PERSONAL FACTORS: 1-2 comorbidities: HTN, DM  are also affecting patient's functional outcome.   REHAB POTENTIAL: Good  CLINICAL DECISION MAKING: Evolving/moderate complexity  EVALUATION COMPLEXITY: Moderate   GOALS: Goals reviewed with patient? Yes  SHORT TERM GOALS: Target date: 12/11/2022  Independent with initial HEP Goal status: Ongoing 11/28/2022  2.  Lt knee AROM improved 0-15-90 deg for improved strength Goal status: Ongoing 11/27/2022   LONG TERM GOALS: Target date: 01/01/2023   Independent with final HEP Goal status: Ongoing 11/27/2022  2.  FOTO score improved to 44 Goal status: Ongoing 11/27/2022  3.  Lt knee AROM improved to 0-120 for improved strength Goal status: Ongoing 11/27/2022  4.  Report pain < 3/10 with standing and walking for improved function Goal status: Ongoing 11/28/2022  5.  Improve 5x STS to < 20 sec for improved strength and balance Goal status: Ongoing 11/27/2022  6.  Improve Timed up and go to < 14 sec for improved mobility Goal status: Ongoing 11/27/2022     PLAN:  PT FREQUENCY: 1-2x/week  PT DURATION: 6 weeks  PLANNED INTERVENTIONS: Therapeutic exercises, Therapeutic activity, Neuromuscular re-education, Balance training, Gait training, Patient/Family education, Self Care, Joint mobilization, Stair training, Orthotic/Fit training, DME instructions, Electrical stimulation, Cryotherapy, Moist heat, Taping, Vasopneumatic device, Ionotophoresis 4mg /ml Dexamethasone, Manual therapy, and Re-evaluation.  PLAN FOR NEXT SESSION: continue strengthening and balance exercises,   Berg test may be recommended as strength improves.   NEXT  MD VISIT: Not scheduled    Clarita Crane, PT, DPT 12/04/22 3:14 PM

## 2022-12-05 ENCOUNTER — Ambulatory Visit: Payer: Medicare PPO | Admitting: Physical Therapy

## 2022-12-05 ENCOUNTER — Encounter: Payer: Self-pay | Admitting: Physical Therapy

## 2022-12-05 DIAGNOSIS — R600 Localized edema: Secondary | ICD-10-CM | POA: Diagnosis not present

## 2022-12-05 DIAGNOSIS — R2689 Other abnormalities of gait and mobility: Secondary | ICD-10-CM | POA: Diagnosis not present

## 2022-12-05 DIAGNOSIS — M6281 Muscle weakness (generalized): Secondary | ICD-10-CM

## 2022-12-05 DIAGNOSIS — M25662 Stiffness of left knee, not elsewhere classified: Secondary | ICD-10-CM | POA: Diagnosis not present

## 2022-12-05 DIAGNOSIS — M25562 Pain in left knee: Secondary | ICD-10-CM | POA: Diagnosis not present

## 2022-12-05 NOTE — Therapy (Signed)
OUTPATIENT PHYSICAL THERAPY TREATMENT   Patient Name: Luis Hawkins MRN: [id number] DOB:01-Mar-1951, 72 y.o., male Today's Date: 12/05/2022  END OF SESSION:  PT End of Session - 12/05/22 1535     Visit Number 5    Number of Visits 12    Date for PT Re-Evaluation 01/01/23    Authorization Type [employer] $25 copay    Authorization Time Period 12 visits; 11/20/22-01/01/23    Progress Note Due on Visit 10    PT Start Time 1530   pt arrived late   PT Stop Time 1553    PT Time Calculation (min) 23 min    Activity Tolerance Patient tolerated treatment well    Behavior During Therapy WFL for tasks assessed/performed                 Past Medical History:  Diagnosis Date   Diabetes mellitus without complication (HCC)    Hypertension    History reviewed. No pertinent surgical history. There are no problems to display for this patient.   PCP: [facility]  REFERRING PROVIDER: Tarry Kos, MD  REFERRING DIAG: [phone] (ICD-10-CM) - Acute medial meniscus tear of left knee, initial encounter  Rationale for Evaluation and Treatment: Rehabilitation  THERAPY DIAG:  Acute pain of left knee  Muscle weakness (generalized)  Stiffness of left knee, not elsewhere classified  Localized edema  Other abnormalities of gait and mobility  ONSET DATE: 10/24/22 (DOS)   SUBJECTIVE:                                                                                                                                                                                           SUBJECTIVE STATEMENT: Doing well; still having a little pain in the knee.  Reports he's back to ADLs without difficulty. Arrives late to PT today "I got lost."  PERTINENT HISTORY:  DM, HTN, hx CVA (3 months ago), ?dementia  PAIN:  Are you having pain?   Yes: NPRS scale: 7/10 Pain location: Lt knee Pain description: sharp, shooting Aggravating factors: walking, "when it gets stiff" Relieving factors: medication,  ice  PRECAUTIONS:  None  RED FLAGS: None   WEIGHT BEARING RESTRICTIONS:  No  FALLS:  Has patient fallen in last 6 months? No  LIVING ENVIRONMENT: Lives with: lives alone and has caregiver 30 hours a week (4 hours Sun-Fri; 6 hours Sat) Lives in: House/apartment Stairs: No; ramped entrance Has following equipment at home: Single point cane, Environmental consultant - 2 wheeled, and Wheelchair (manual)  OCCUPATION:  Retired from [employer]  PLOF:  Independent with basic ADLs, Leisure: TV and golf, occasional walking, and caregiver provides some  light housekeeping, companion, meal prep, medication  PATIENT GOALS:  Improve mobility in Lt knee and improve balance   OBJECTIVE:   DIAGNOSTIC FINDINGS:  MRI: meniscus tear  PATIENT SURVEYS:  11/20/22:  FOTO 22 (predicted 44)  COGNITIVE STATUS: History of cognitive impairments - at baseline   SENSATION: WFL  POSTURE:  rounded shoulders and forward head  GAIT: 11/20/22 Distance walked: 100' Assistive device utilized: Single point cane Level of assistance: SBA Comments: Lt foot drop with genu recurvatum, decreased DF bil; ataxia, trendelenburg on Lt   LOWER EXTREMITY ROM:     ROM Right eval Left eval Left 12/05/22  Knee flexion  A: 44 P: 143 A: 112  Knee extension   A: -35 P: 0 A: -5 (seated LAQ)   (Blank rows = not tested) LOWER EXTREMITY MMT:    MMT Right eval Left eval  Knee flexion  2+/5  Knee extension  2+/5   (Blank rows = not tested)    FUNCTIONAL TESTS:  11/20/22 5 times sit to stand: 30.53 sec with UE support needed Timed up and go (TUG): 16.56 sec with SPC   TREATMENT:                                                                                                                              DATE:  12/05/22 TherEx NuStep L7 x 6 min LAQ 4# on Lt 2x10 AROM measurements - see above for details Supine SLR on Lt 2x10 - extensor lag present; difficulty following instructions Sit to/from stand from elevated surface  x 5 reps  12/04/22 TherEx NuStep L7 x 10 min Seated LAQ 2x10; 4#; LLE only Seated SLR on Lt 2x10 Sit to/from stand with intermittent UE support 2x10 BATCA knee extension 5# LLE only 2x10 BATCA hamstring curl 10# LLE only 2x10   11/27/2022: Quadriceps sets with 2 pillows under left knee 2 sets of 10 for 5 seconds Bridgeing 10 x 5 seconds Seated marching 10 x Seated hamstrings sets 10 x 3seconds Seated LAQ 10 x 3 seconds Sit to stand with UE support and slow eccentrics 5 x  Vaso left knee 5 minutes Medium Pressure 34*   11/22/22 TherEx Seated LAQ on Lt 2x10; 5 sec hold Seated hamstring set 2x10; 5 sec hold - tactile feedback needed for correct technique NuStep LEs only L6 x 10 min Leg Press 75# bil push 2x10   Orthotic Training Trial of anterior and posterior support AFOs with 1/4" heel lift to determine most appropriate AFO for pt.  Pt still with some recurvatum and toe drag but improved with both, more with anterior support.  Feel with proper fitting (both trial AFOs too small in clinic) this will be best option for pt for safer mobility.  Amb with SPC and minguard A with AFOs  11/20/22 See HEP - demonstrated with trial reps PRN for comprehension     PATIENT EDUCATION:  Education details: HEP Person educated: Patient and Caregiver  Education method: Explanation, Demonstration, and Handouts Education comprehension: verbalized understanding, returned demonstration, and needs further education  HOME EXERCISE PROGRAM: Access Code: [email or web address] URL: [email or web address] Date: 11/20/2022 Prepared by: Moshe Cipro  Exercises - Sit to Stand  - 2 x daily - 7 x weekly - 1 sets - 10 reps - Seated Long Arc Quad  - 2 x daily - 7 x weekly - 1 sets - 10 reps - Seated Hamstring Set  - 2 x daily - 7 x weekly - 1 sets - 10 reps - 5 sec hold - Seated March  - 2 x daily - 7 x weekly - 1 sets - 10 reps - Supine Bridge  - 2 x daily - 7 x weekly - 1 sets - 10 reps - 5  sec hold  Patient Education - Attendance Policy   ASSESSMENT:  CLINICAL IMPRESSION: Pt needs mod to max cues for technique with exercises.  Feel he is nearing his baseline for PLOF and may be ready for d/c soon.  Will continue to benefit from PT to maximize function.  OBJECTIVE IMPAIRMENTS: Abnormal gait, decreased balance, decreased cognition, decreased knowledge of use of DME, decreased mobility, difficulty walking, decreased ROM, decreased strength, and pain.   ACTIVITY LIMITATIONS: carrying, lifting, bending, standing, transfers, bed mobility, and locomotion level  PARTICIPATION LIMITATIONS: cleaning, driving, and community activity  PERSONAL FACTORS: 1-2 comorbidities: HTN, DM  are also affecting patient's functional outcome.   REHAB POTENTIAL: Good  CLINICAL DECISION MAKING: Evolving/moderate complexity  EVALUATION COMPLEXITY: Moderate   GOALS: Goals reviewed with patient? Yes  SHORT TERM GOALS: Target date: 12/11/2022  Independent with initial HEP Goal status: Ongoing 11/28/2022  2.  Lt knee AROM improved 0-15-90 deg for improved strength Goal status: Ongoing 11/27/2022   LONG TERM GOALS: Target date: 01/01/2023   Independent with final HEP Goal status: Ongoing 11/27/2022  2.  FOTO score improved to 44 Goal status: Ongoing 11/27/2022  3.  Lt knee AROM improved to 0-120 for improved strength Goal status: Ongoing 11/27/2022  4.  Report pain < 3/10 with standing and walking for improved function Goal status: Ongoing 11/28/2022  5.  Improve 5x STS to < 20 sec for improved strength and balance Goal status: Ongoing 11/27/2022  6.  Improve Timed up and go to < 14 sec for improved mobility Goal status: Ongoing 11/27/2022     PLAN:  PT FREQUENCY: 1-2x/week  PT DURATION: 6 weeks  PLANNED INTERVENTIONS: Therapeutic exercises, Therapeutic activity, Neuromuscular re-education, Balance training, Gait training, Patient/Family education, Self Care, Joint mobilization, Stair  training, Orthotic/Fit training, DME instructions, Electrical stimulation, Cryotherapy, Moist heat, Taping, Vasopneumatic device, Ionotophoresis 4mg /ml Dexamethasone, Manual therapy, and Re-evaluation.  PLAN FOR NEXT SESSION: continue strengthening and balance exercises,   NEXT MD VISIT: Not scheduled    Clarita Crane, PT, DPT 12/05/22 3:55 PM

## 2022-12-11 ENCOUNTER — Encounter: Payer: Self-pay | Admitting: Physical Therapy

## 2022-12-11 ENCOUNTER — Ambulatory Visit: Payer: Medicare PPO | Admitting: Physical Therapy

## 2022-12-11 DIAGNOSIS — M6281 Muscle weakness (generalized): Secondary | ICD-10-CM

## 2022-12-11 DIAGNOSIS — M25562 Pain in left knee: Secondary | ICD-10-CM | POA: Diagnosis not present

## 2022-12-11 DIAGNOSIS — R600 Localized edema: Secondary | ICD-10-CM | POA: Diagnosis not present

## 2022-12-11 DIAGNOSIS — M25662 Stiffness of left knee, not elsewhere classified: Secondary | ICD-10-CM

## 2022-12-11 DIAGNOSIS — R2689 Other abnormalities of gait and mobility: Secondary | ICD-10-CM | POA: Diagnosis not present

## 2022-12-11 NOTE — Therapy (Signed)
OUTPATIENT PHYSICAL THERAPY TREATMENT DISCHARGE SUMMARY   Patient Name: Luis Hawkins MRN: [id number] DOB:02-Jun-1950, 72 y.o., male Today's Date: 12/11/2022  END OF SESSION:  PT End of Session - 12/11/22 1441     Visit Number 6    Number of Visits 12    Date for PT Re-Evaluation 01/01/23    Authorization Type [employer] $25 copay    Authorization Time Period 12 visits; 11/20/22-01/01/23    Progress Note Due on Visit 10    PT Start Time 1443    PT Stop Time 1525    PT Time Calculation (min) 42 min    Activity Tolerance Patient tolerated treatment well    Behavior During Therapy WFL for tasks assessed/performed                  Past Medical History:  Diagnosis Date   Diabetes mellitus without complication (HCC)    Hypertension    History reviewed. No pertinent surgical history. There are no problems to display for this patient.   PCP: [facility]  REFERRING PROVIDER: Tarry Kos, MD  REFERRING DIAG: [phone] (ICD-10-CM) - Acute medial meniscus tear of left knee, initial encounter  Rationale for Evaluation and Treatment: Rehabilitation  THERAPY DIAG:  Acute pain of left knee  Muscle weakness (generalized)  Stiffness of left knee, not elsewhere classified  Localized edema  Other abnormalities of gait and mobility  ONSET DATE: 10/24/22 (DOS)   SUBJECTIVE:                                                                                                                                                                                           SUBJECTIVE STATEMENT: Caregiver is no longer assisting patient; he has family that calls him  PERTINENT HISTORY:  DM, HTN, hx CVA (3 months ago), ?dementia  PAIN:  Are you having pain?   Yes: NPRS scale: 7/10 Pain location: Lt knee Pain description: sharp, shooting Aggravating factors: walking, "when it gets stiff" Relieving factors: medication, ice  PRECAUTIONS:  None  RED FLAGS: None   WEIGHT BEARING  RESTRICTIONS:  No  FALLS:  Has patient fallen in last 6 months? No  LIVING ENVIRONMENT: Lives with: lives alone and has caregiver 30 hours a week (4 hours Sun-Fri; 6 hours Sat) Lives in: House/apartment Stairs: No; ramped entrance Has following equipment at home: Single point cane, Environmental consultant - 2 wheeled, and Wheelchair (manual)  OCCUPATION:  Retired from [employer]  PLOF:  Independent with basic ADLs, Leisure: TV and golf, occasional walking, and caregiver provides some light housekeeping, companion, meal prep, medication  PATIENT GOALS:  Improve mobility in Lt knee  and improve balance   OBJECTIVE:   DIAGNOSTIC FINDINGS:  MRI: meniscus tear  PATIENT SURVEYS:  11/20/22:  FOTO 22 (predicted 44) 12/11/22: FOTO 62  COGNITIVE STATUS: History of cognitive impairments - at baseline   SENSATION: WFL  POSTURE:  rounded shoulders and forward head  GAIT: 11/20/22 Distance walked: 100' Assistive device utilized: Single point cane Level of assistance: SBA Comments: Lt foot drop with genu recurvatum, decreased DF bil; ataxia, trendelenburg on Lt   LOWER EXTREMITY ROM:     ROM Left eval Left 12/05/22 Left 12/11/22  Knee flexion A: 44 P: 143 A: 112 A: 134  Knee extension A: -35 P: 0 A: -5 (seated LAQ) A: 0   (Blank rows = not tested) LOWER EXTREMITY MMT:    MMT Left eval  Knee flexion 2+/5  Knee extension 2+/5   (Blank rows = not tested)    FUNCTIONAL TESTS:  12/11/22: 5x STS: 20.06 sec with UE support TUG: 17.84 sec with [facility]   11/20/22 5 times sit to stand: 30.53 sec with UE support needed Timed up and go (TUG): 16.56 sec with SPC   TREATMENT:                                                                                                                              DATE:  12/11/22 TherEx NuStep L7 x 10 min TUG and 5x STS - see above; multiple attempts and increased time needed for explanation ROM measurements - see above  Self-Care Recommended fall detection  device; provided information on different options. Continued to recommend he follow up with PCP about AFO  12/05/22 TherEx NuStep L7 x 6 min LAQ 4# on Lt 2x10 AROM measurements - see above for details Supine SLR on Lt 2x10 - extensor lag present; difficulty following instructions Sit to/from stand from elevated surface x 5 reps  12/04/22 TherEx NuStep L7 x 10 min Seated LAQ 2x10; 4#; LLE only Seated SLR on Lt 2x10 Sit to/from stand with intermittent UE support 2x10 BATCA knee extension 5# LLE only 2x10 BATCA hamstring curl 10# LLE only 2x10   11/27/2022: Quadriceps sets with 2 pillows under left knee 2 sets of 10 for 5 seconds Bridgeing 10 x 5 seconds Seated marching 10 x Seated hamstrings sets 10 x 3seconds Seated LAQ 10 x 3 seconds Sit to stand with UE support and slow eccentrics 5 x  Vaso left knee 5 minutes Medium Pressure 34*   11/22/22 TherEx Seated LAQ on Lt 2x10; 5 sec hold Seated hamstring set 2x10; 5 sec hold - tactile feedback needed for correct technique NuStep LEs only L6 x 10 min Leg Press 75# bil push 2x10   Orthotic Training Trial of anterior and posterior support AFOs with 1/4" heel lift to determine most appropriate AFO for pt.  Pt still with some recurvatum and toe drag but improved with both, more with anterior support.  Feel with proper fitting (both trial AFOs too small  in clinic) this will be best option for pt for safer mobility.  Amb with SPC and minguard A with AFOs  11/20/22 See HEP - demonstrated with trial reps PRN for comprehension     PATIENT EDUCATION:  Education details: HEP Person educated: Patient and Caregiver   Education method: Explanation, Demonstration, and Handouts Education comprehension: verbalized understanding, returned demonstration, and needs further education  HOME EXERCISE PROGRAM: Access Code: [email or web address] URL: [email or web address] Date: 11/20/2022 Prepared by: Moshe Cipro  Exercises - Sit to  Stand  - 2 x daily - 7 x weekly - 1 sets - 10 reps - Seated Long Arc Quad  - 2 x daily - 7 x weekly - 1 sets - 10 reps - Seated Hamstring Set  - 2 x daily - 7 x weekly - 1 sets - 10 reps - 5 sec hold - Seated March  - 2 x daily - 7 x weekly - 1 sets - 10 reps - Supine Bridge  - 2 x daily - 7 x weekly - 1 sets - 10 reps - 5 sec hold  Patient Education - Attendance Policy   ASSESSMENT:  CLINICAL IMPRESSION: Pt has met all goals at this time except timed up and go which is more due to his CVA rather than knee scope. He is still a fall risk due to his CVA, and recommend he continue to utilize his ADs for ambulation as well as considering fall detection device.  Will d/c PT today.  OBJECTIVE IMPAIRMENTS: Abnormal gait, decreased balance, decreased cognition, decreased knowledge of use of DME, decreased mobility, difficulty walking, decreased ROM, decreased strength, and pain.   ACTIVITY LIMITATIONS: carrying, lifting, bending, standing, transfers, bed mobility, and locomotion level  PARTICIPATION LIMITATIONS: cleaning, driving, and community activity  PERSONAL FACTORS: 1-2 comorbidities: HTN, DM  are also affecting patient's functional outcome.   REHAB POTENTIAL: Good  CLINICAL DECISION MAKING: Evolving/moderate complexity  EVALUATION COMPLEXITY: Moderate   GOALS: Goals reviewed with patient? Yes  SHORT TERM GOALS: Target date: 12/11/2022  Independent with initial HEP Goal status: MET 12/11/22  2.  Lt knee AROM improved 0-15-90 deg for improved strength Goal status: MET 12/11/22   LONG TERM GOALS: Target date: 01/01/2023   Independent with final HEP Goal status: MET 12/11/22  2.  FOTO score improved to 44 Goal status: MET 12/11/22  3.  Lt knee AROM improved to 0-120 for improved strength Goal status: MET 12/11/22  4.  Report pain < 3/10 with standing and walking for improved function Goal status: MET 12/11/22  5.  Improve 5x STS to < 20 sec for improved strength and  balance Goal status: MET 12/11/22  6.  Improve Timed up and go to < 14 sec for improved mobility Goal status: NOT MET 12/11/22     PLAN:  PT FREQUENCY: 1-2x/week  PT DURATION: 6 weeks  PLANNED INTERVENTIONS: Therapeutic exercises, Therapeutic activity, Neuromuscular re-education, Balance training, Gait training, Patient/Family education, Self Care, Joint mobilization, Stair training, Orthotic/Fit training, DME instructions, Electrical stimulation, Cryotherapy, Moist heat, Taping, Vasopneumatic device, Ionotophoresis 4mg /ml Dexamethasone, Manual therapy, and Re-evaluation.  PLAN FOR NEXT SESSION: d/c PT today  NEXT MD VISIT: Not scheduled    Clarita Crane, PT, DPT 12/11/22 3:37 PM    PHYSICAL THERAPY DISCHARGE SUMMARY  Visits from Start of Care: 6  Current functional level related to goals / functional outcomes: See above   Remaining deficits: See above   Education / Equipment: HEP   Patient agrees to discharge. Patient goals were  met. Patient is being discharged due to meeting the stated rehab goals.  Clarita Crane, PT, DPT 12/11/22 3:37 PM  [facility] [address] [place], [place], [address] Phone: [phone]   Fax:  [phone]

## 2022-12-12 ENCOUNTER — Encounter: Payer: Medicare PPO | Admitting: Rehabilitative and Restorative Service Providers"

## 2022-12-17 ENCOUNTER — Encounter: Payer: Medicare PPO | Admitting: Rehabilitative and Restorative Service Providers"

## 2022-12-19 ENCOUNTER — Encounter: Payer: Medicare PPO | Admitting: Physical Therapy

## 2023-04-29 ENCOUNTER — Emergency Department (HOSPITAL_COMMUNITY): Payer: No Typology Code available for payment source

## 2023-04-29 ENCOUNTER — Emergency Department (HOSPITAL_COMMUNITY)
Admission: EM | Admit: 2023-04-29 | Discharge: 2023-04-29 | Disposition: A | Discharge status: Home / Self Care | Payer: No Typology Code available for payment source | Attending: Emergency Medicine | Admitting: Emergency Medicine

## 2023-04-29 ENCOUNTER — Other Ambulatory Visit: Payer: Self-pay

## 2023-04-29 ENCOUNTER — Encounter (HOSPITAL_COMMUNITY): Payer: Self-pay

## 2023-04-29 DIAGNOSIS — W19XXXA Unspecified fall, initial encounter: Secondary | ICD-10-CM

## 2023-04-29 DIAGNOSIS — M25552 Pain in left hip: Secondary | ICD-10-CM | POA: Diagnosis present

## 2023-04-29 DIAGNOSIS — M25562 Pain in left knee: Secondary | ICD-10-CM | POA: Diagnosis not present

## 2023-04-29 DIAGNOSIS — W1839XA Other fall on same level, initial encounter: Secondary | ICD-10-CM | POA: Diagnosis not present

## 2023-04-29 MED ORDER — ACETAMINOPHEN 500 MG PO TABS
1000.0000 mg | ORAL_TABLET | Freq: Once | ORAL | Status: AC
  Administered 2023-04-29: 1000 mg via ORAL

## 2023-04-29 NOTE — ED Provider Notes (Signed)
 [place] EM[facility]GENC[facility] DEPARTMENT AT [facility] Provider Note   CSN: [id number] Arrival date & time: 04/29/23  9096     History  Chief Complaint  Patient presents with   Luis Hawkins. is a 73 y.o. male.   Fall  73 year old male here for fall.  He states he lost his balance yesterday and fell.  He landed on his left hip and knee.  He did not hit his head and is not on blood thinners.  He has no headache or neck pain.  No chest pain or shortness of breath or back pain or belly pain.  He has pain to his left hip and left knee.  No pain distal to the knee.  No weakness or numbness.  He does feel somewhat tight in his leg when he walks due to pain.     Home Medications Prior to Admission medications   Medication Sig Start Date End Date Taking? Authorizing Provider  benzonatate  (TESSALON ) 100 MG capsule Take 1 capsule (100 mg total) by mouth every 8 (eight) hours. 04/12/22   Enedelia Dorna HERO, FNP  docusate sodium (COLACE) 100 MG capsule Take 100 mg by mouth daily.    [provider]  guaiFENesin 200 MG tablet Take 400 mg by mouth every 4 (four) hours as needed for cough or to loosen phlegm.    [provider]  HYDROcodone -acetaminophen  (NORCO) 5-325 MG tablet Take 1 tablet by mouth 3 (three) times daily as needed. To be taken after surgery 10/28/22   Jule Ronal CROME, PA-C  hydrOXYzine  (ATARAX /VISTARIL ) 25 MG tablet Take 1 tablet (25 mg total) by mouth every 6 (six) hours. 08/06/20   Harris, Abigail, PA-C  ipratropium (ATROVENT) 0.06 % nasal spray Place 2 sprays into both nostrils daily.    [provider]  levothyroxine (SYNTHROID) 25 MCG tablet Take 25 mcg by mouth daily before breakfast.    [provider]  losartan (COZAAR) 50 MG tablet Take 50 mg by mouth daily.    [provider]  meloxicam (MOBIC) 7.5 MG tablet Take 15 mg by mouth daily.    [provider]  metoprolol tartrate (LOPRESSOR) 25 MG tablet  Take 25 mg by mouth 2 (two) times daily.    [provider]  mirtazapine (REMERON) 15 MG tablet Take 15 mg by mouth at bedtime.    [provider]  OLANZapine (ZYPREXA) 10 MG tablet Take 10 mg by mouth at bedtime.    [provider]  omega-3 acid ethyl esters (LOVAZA) 1 g capsule Take 1 g by mouth daily.    [provider]  ondansetron  (ZOFRAN ) 4 MG tablet Take 1 tablet (4 mg total) by mouth every 8 (eight) hours as needed for nausea or vomiting. 10/21/22   Jule Ronal CROME, PA-C  pantoprazole (PROTONIX) 20 MG tablet Take 20 mg by mouth daily.    [provider]  silver  sulfADIAZINE  (SILVADENE ) 1 % cream Apply topically 2 (two) times daily. 06/04/13   Piepenbrink, Delon, PA-C  simvastatin (ZOCOR) 10 MG tablet Take 10 mg by mouth daily.    [provider]  vardenafil (LEVITRA) 20 MG tablet Take 20 mg by mouth daily as needed for erectile dysfunction.    [provider]      Allergies    Patient has no known allergies.    Review of Systems   Review of Systems Review of systems completed and notable as per HPI.  ROS otherwise negative.  Physical Exam Updated Vital Signs BP (!) 157/100 (BP Location: Right Arm)   Pulse 84   Temp (!) 97.4 F (36.3 C) (Oral)   Resp 16   Ht 6' 2 (1.88 m)   Wt 83.9 kg   SpO2 98%   BMI 23.75 kg/m  Physical Exam Vitals and nursing note reviewed.  Constitutional:      General: He is not in acute distress.    Appearance: He is well-developed.  HENT:     Head: Normocephalic and atraumatic.  Eyes:     Conjunctiva/sclera: Conjunctivae normal.  Cardiovascular:     Rate and Rhythm: Normal rate and regular rhythm.     Pulses: Normal pulses.     Heart sounds: Normal heart sounds. No murmur heard. Pulmonary:     Effort: Pulmonary effort is normal. No respiratory distress.     Breath sounds: Normal breath sounds.  Abdominal:     Palpations: Abdomen is soft.     Tenderness: There is no  abdominal tenderness.  Musculoskeletal:        General: No swelling.     Cervical back: Neck supple.     Comments: Mild tenderness along the left greater trochanter and anterior knee.  No external signs of trauma.  He has full strength the hip, knee, ankle.  Distal pulses intact.  No joint laxity.  Ambulates without assistance.  Skin:    General: Skin is warm and dry.     Capillary Refill: Capillary refill takes less than 2 seconds.  Neurological:     General: No focal deficit present.     Mental Status: He is alert and oriented to person, place, and time. Mental status is at baseline.  Psychiatric:        Mood and Affect: Mood normal.     ED Results / Procedures / Treatments   Labs (all labs ordered are listed, but only abnormal results are displayed) Labs Reviewed - No data to display  EKG None  Radiology DG Hip Unilat With Pelvis 2-3 Views Left Result Date: 04/29/2023 CLINICAL DATA:  Mechanical fall with left hip pain EXAM: DG HIP (WITH OR WITHOUT PELVIS) 3V LEFT COMPARISON:  None Available. FINDINGS: There is no evidence of acute displaced hip fracture or dislocation. 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 IMPRESSION: 1. No acute displaced hip fracture or dislocation. 2. 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 Electronically Signed   By: Limin  Xu M.D.   On: 04/29/2023 15:09   DG Knee Complete 4 Views Left Result Date: 04/29/2023 CLINICAL DATA:  Fall and left hip and knee pain EXAM: LEFT KNEE - COMPLETE 4+ VIEW COMPARISON:  05/17/2022 FINDINGS: No acute fracture or dislocation. Soft tissues are radiographically unremarkable. No knee joint effusion. IMPRESSION: No acute fracture or dislocation. Electronically Signed   By: Norman Gatlin M.D.   On: 04/29/2023 15:07    Procedures Procedures    Medications Ordered in ED Medications  acetaminophen  (TYLENOL ) tablet 1,000 mg (1,000 mg Oral Given 04/29/23 1335)    ED Course/ Medical Decision Making/ A&P Clinical  Course as of 04/29/23 1543  Tue Apr 29, 2023  1532 Strays negative.  Pain improved.  Ambulated without difficulty. [JD]    Clinical Course User Index [JD] Nicholaus Cassondra DEL, MD                                 Medical Decision Making Amount and/or Complexity of  Data Reviewed Radiology: ordered.  Risk OTC drugs.   Medical Decision Making:   Elis Sauber. is a 73 y.o. male who presented to the ED today with mechanical fall.  Vital signs noted for hypertension.  Patient mechanical fall yesterday.  He did not hit his head is not on anticoagulation has no signs of head or neck trauma.  Pain is isolated to the left hip and left knee.  Will obtain x-rays for evaluation.  He is neuro intact.   Patient placed on continuous vitals and telemetry monitoring while in ED which was reviewed periodically.  Reviewed and confirmed nursing documentation for past medical history, family history, social history.  Reassessment and Plan:   On reassessment pain is improved with Tylenol .  X-rays reviewed, no signs of fracture or malalignment.  Suspect likely soft tissue bruising and pain from this.  He is ambulating without difficulty.  Recommend he follow close with his PCP.  Return precautions given.   Patient's presentation is most consistent with acute complicated illness / injury requiring diagnostic workup.           Final Clinical Impression(s) / ED Diagnoses Final diagnoses:  Fall, initial encounter    Rx / DC Orders ED Discharge Orders     None         Nicholaus Cassondra DEL, MD 04/29/23 [phone]

## 2023-04-29 NOTE — ED Triage Notes (Signed)
Pt came to ED for a mechanical fall that happened yesterday morning. Denies LOC and denies hitting head. No blood thinners. C/O left hip/knee pian.

## 2023-04-29 NOTE — Discharge Instructions (Signed)
 Follow-up with your primary care doctor.  Return for new or worsening symptoms.

## 2023-05-23 ENCOUNTER — Telehealth: Payer: Self-pay | Admitting: General Practice

## 2023-05-23 NOTE — Telephone Encounter (Signed)
 Copied from CRM [id number]. Topic: General - Other >> May 23, 2023  1:08 PM Marland Kitchen D wrote: Reason for CRM: Patient called in stating he does not want to be a donor or added to the donor list. He wants confirmation that he is not listed. Please give patient a call.

## 2023-05-30 ENCOUNTER — Telehealth: Payer: Self-pay

## 2023-05-30 NOTE — Telephone Encounter (Signed)
 Request for Shoulder Orthosis, Paperwork with Renee.

## 2023-05-30 NOTE — Telephone Encounter (Signed)
 Patient advised  I cannot do that.  I have not seen him in over a year.  He does not have definitive diagnosis as he never followed up.  Neurology doesn't typically prescribe those.  Shoulder issue is typically an orthopedic issues.  He may want to follow up with his PCP    Patient wants the forms mailed to him so he can give them to his PCP office.
